# Patient Record
Sex: Male | Born: 1960 | Race: White | Hispanic: No | Marital: Married | Smoking: Never smoker
Health system: Southern US, Community
[De-identification: ages and names within clinical notes are randomized; demographics above are authoritative.]

## PROBLEM LIST (undated history)

## (undated) DIAGNOSIS — J45909 Unspecified asthma, uncomplicated: Secondary | ICD-10-CM

## (undated) DIAGNOSIS — M109 Gout, unspecified: Secondary | ICD-10-CM

## (undated) DIAGNOSIS — J189 Pneumonia, unspecified organism: Secondary | ICD-10-CM

## (undated) DIAGNOSIS — Z87442 Personal history of urinary calculi: Secondary | ICD-10-CM

## (undated) HISTORY — PX: CYSTOSCOPY: SUR368

---

## 2004-03-25 ENCOUNTER — Inpatient Hospital Stay (HOSPITAL_COMMUNITY): Admission: EM | Admit: 2004-03-25 | Discharge: 2004-03-27 | Payer: Self-pay | Admitting: Emergency Medicine

## 2006-11-09 ENCOUNTER — Encounter: Admission: RE | Admit: 2006-11-09 | Discharge: 2006-11-09 | Payer: Self-pay | Admitting: Emergency Medicine

## 2008-04-03 ENCOUNTER — Ambulatory Visit: Payer: Self-pay | Admitting: Cardiology

## 2008-04-24 ENCOUNTER — Ambulatory Visit: Payer: Self-pay

## 2008-04-24 ENCOUNTER — Ambulatory Visit: Payer: Self-pay | Admitting: Cardiology

## 2010-07-22 ENCOUNTER — Encounter: Admission: RE | Admit: 2010-07-22 | Discharge: 2010-07-22 | Payer: Self-pay | Admitting: Family Medicine

## 2011-04-29 NOTE — Procedures (Signed)
[facility]                              EXERCISE TREADMILL   Chris Mosley, Chris Mosley                         MRN:          [id number]  DATE:04/24/2008                            DOB:          15-Jun-1961    The duration of exercise was 9 minutes.  Maximum heart rate 155.  Percent of PMHR is 88%.  MET level 10.4.   INDICATIONS:  Atypical chest pain.   HISTORY OF EXERCISE TEST:  The patient exercised today on the Bruce  protocol for a total of 9 minutes.  Maximum heart rate reached 155,  which is effectively 88% of age-predicted maximum and the test is  diagnostic.  Resting blood pressure was 148/77 and the pulse was 79.  Peak blood pressure was 212/60.  The resting electrocardiogram was  within normal limits at maximum stress.  There was no ST-segment  depression and the study was felt to be a normal exercise response  electrocardiographically and clinically.  There was a modestly  hypertensive response.   CONCLUSIONS:  Negative exercise tolerance test demonstrating no evidence  of exercise-induced chest pain or significant ST depression.  Mild  hypertensive response.      Arturo Morton. Riley Kill, MD, [facility]  Electronically Signed    TDS/MedQ  DD: 05/09/2009  DT: 05/09/2009  Job #: [id number]   cc:   Oley Balm. Georgina Pillion, M.D.

## 2011-04-29 NOTE — Assessment & Plan Note (Signed)
[facility]                            CARDIOLOGY OFFICE NOTE   NAME:Lemanski, KEIJUAN SCHELLHASE                       MRN:          [id number]  DATE:04/03/2008                            DOB:          [birth year]    CHIEF COMPLAINT:  Neck pain, indigestion, all of the time.   HISTORY OF PRESENT ILLNESS:  Mr. Dispenza is a 50 year old gentleman  whose father is a patient of mine.  The patient has been referred by Dr.  Georgina Pillion for some atypical symptoms.  He has had some neck discomfort and  he says he has indigestion a lot.  His waist size is approximately 42.  He rarely has chest discomfort.  He is a nonsmoker, but has been  overweight.  He has what he describes as habitual indigestion.  He has  had some discomfort in the chest before with some soreness.  None of  this is related to exertion or activity and he burps a lot.  There is no  real radiation except the fact that it sometimes go on to the arm and  shoulder when he is just sitting and resting.  There is no diaphoresis  whatsoever.  He does get little soreness in the chest from time to time,  but he sleeps on his stomach.  He has been on Zocor for more than 1 year  for hypercholesterolemia.  He will walk up to 3 to 4 miles a day and do  yard work with rare discomfort.   PAST MEDICAL HISTORY:  There is no history of diabetes or hypertension.  The patient has had kidney stones, and has had a ureteroscopy.  He had a  back cyst of the spine.   SOCIAL HISTORY:  The patient will have a very rare cigar, otherwise, he  is a non smoker.  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   FAMILY HISTORY:  His mother is alive at 33 and well.  He has 1 brother,  2 sisters all okay.  His father is 38 and he has had bypass surgery.   REVIEW OF SYSTEMS:  He has rarely had some loose stools.  He has some  allergies.  His weight has been up and down ranging from 230-260 from  time to time.  He has not had  significant abdominal pain or discomfort.  He has tolerated the simvastatin.   PHYSICAL EXAMINATION:  GENERAL:  He is alert, oriented gentleman, in no  distress.  VITAL SIGNS:  The weight is 278 pounds.  Blood pressure 124/70, the  pulse is 64.  LUNGS:  Lung fields are clear to auscultation and percussion.  PMI is  nondisplaced.  There is normal first and second heart sound without  murmur, rub, or gallop.  ABDOMEN:  Obese without hepatosplenomegaly.  EXTREMITIES:  No extremity edema.  Pulses are intact equal bilaterally.   EKG reveals normal sinus rhythm within normal limits.   IMPRESSION:  1. Positive family history of coronary artery disease.  2. Moderate obesity.  3. Hypercholesterolemia on lipid lowering therapy.  4. Atypical chest discomfort  without exertional symptoms.   RECOMMENDATIONS:  1. A routine exercise tolerance test will be recommended.  2. Optimization of a cholesterol management.  3. Discussion regarding adult dysmetabolic syndrome, and the benefit      of weight reduction.     Arturo Morton. Riley Kill, MD, [facility]  Electronically Signed    TDS/MedQ  DD: 05/09/2009  DT: 05/10/2009  Job #: [id number]   cc:   Oley Balm. Georgina Pillion, M.D.

## 2011-05-02 NOTE — H&P (Signed)
NAME:  Chris Mosley, Chris Mosley NO.:  [id number]   MEDICAL RECORD NO.:  [id number]                   PATIENT TYPE:  INP   LOCATION:  0101                                 FACILITY:  [facility]   PHYSICIAN:  Isla Pence, M.D.             DATE OF BIRTH:  23-Feb-1961   DATE OF ADMISSION:  03/25/2004  DATE OF DISCHARGE:                                HISTORY & PHYSICAL   IDENTIFYING DATA:  This is a 50 year old Caucasian gentleman with no  significant past medical history whose primary care physician is Dr. Georgina Pillion.   CHIEF COMPLAINT:  Sick.   HISTORY OF PRESENT ILLNESS:  The patient is with no underlying respiratory  illness was sent to the emergency room by Dr. Georgina Pillion who after Dr. Georgina Pillion  had called and spoke to me about this gentleman with complaints of high  fever of 102.5 since Friday.  The patient says he has had this fever on and  off since then.  The lowest temperature has been 100.  He has taken Extra  Strength Tylenol and Advil, about three tablets of each of them at one time,  alternating with the other without any significant help in the fever.  The  patient denies any shortness of breath.  The only other symptom that  developed since the fever was a cough that started yesterday.  The patient  denies any rhinorrhea or sore throat.  The patient has noted a headache  today.  The patient notes the cough as being dry-hacky in nature, denies any  hemoptysis.  The patient denies any ear pain.  No one else has been sick.  He noted diarrhea on Saturday which lasted only for two episodes.  The  patient denies any tobacco use.  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  The patient has had no recent travel.  His last  travel was about three weeks ago, and that was only a two hour flight to  somewhere in [place].  The patient has been very, very active, and has  not been sedentary at all.  The patient also denies any weight loss.   The  patient denies once again asthma, history of emphysema.  He is not a  diabetic either.   Dr. Georgina Pillion, his primary care physician, had called me from the clinic with  concerns of a left lower lobe pneumonia.  His saturations on room air in the  clinic were 91 to 93%.  He did have a CBC done there which showed a normal  white count at 7000, H&H of 15.2 and 44.4.  The neutrophils on this  differential were 69, and lymphocytes of 25.   ALLERGIES:  No known drug allergies.   CURRENT MEDICATIONS:  Asides from the ones mentioned above, he is not on any  chronic medications.   PAST MEDICAL HISTORY:  History of nephrolithiasis.  Once again,  he denies  any hypertension, diabetes mellitus, high cholesterol.   PAST SURGICAL HISTORY:  1. Kidney stones removed x2.  He cannot remember which side they were.     These were cystine stones.  2. Dental extractions x2.   SOCIAL HISTORY:  He has been married for the past seven years.  This is his  first marriage.  He has two step-kids from his current wife.  He works in  Audiological scientist.  Once again, denies any tobacco.  Alcohol-wise he  drinks an occasional couple of glasses of wine, maybe two to three times a  week.   FAMILY HISTORY:  Positive for diabetes mellitus in the maternal grandmother  and mother, whom he says might have borderline diabetes, so it sounds like  she probably is a diabetic.  Hypertension in the oldest sister.  There is no  coronary artery disease or MI in the family.  There is no CVA history in the  family.  There is breast cancer in the mother who is currently alive at 58.  She was diagnosed at the age of 40.  The patient denies any colon or  prostate cancer in the family.  There is no family history of emphysema  either.   REVIEW OF SYSTEMS:  As per HPI.  He otherwise notes some mild back pain and  some left-sided chest pain secondary to his coughing.  Otherwise, the  patient denies heartburn, melena,  hematochezia.  The patient has also noted  some mild burning on urination that just started either yesterday or today,  but denies any hematuria.  He denies his back pain as what he would normally  get if he had an active kidney stone.   PHYSICAL EXAMINATION:  VITAL SIGNS:  His initial vitals in the ER:  Blood  pressure 128/79, pulse of 96, temperature 101.2, respiratory rate of 22, his  saturations on room air were 94 to 96%.  GENERAL:  He does not appear to be dyspneic or tachypneic, and he certainly  does not appear to be terribly sick.  HEENT:  His TMs are normal.  Nose normal mucosa.  Oropharynx there is no  exudates, no significant erythema either.  NECK:  There is no significant adenopathy.  LUNGS:  Actually clear to auscultation bilaterally without any crackles or  wheezes.  There might be very minimally when you compared to the right,  slightly diminished breath sounds on the left.  HEART:  Regular rate and rhythm without any murmurs, rubs, or gallops.  ABDOMEN:  Bowel sounds are normal, soft, nontender.  No organomegaly.  BACK:  There is no CVA angle tenderness, no spinous tenderness.  EXTREMITIES:  He moves all extremities.  There is no edema.  NEUROLOGIC:  Grossly normal.   LABORATORY DATA:  The only two labs that I have is the blood gas on room air  with a pH of 7.44, PCO2 of 37, PAO2 of 79, bicarbonate was 24.  His CBC has  come back normal with a white count of 6.7, H&H of 14.5 and 42.2, platelet  count of 218,000.  His differential is also normal with 58% neutrophils,  lymphocytes at 29%.  His BNP is currently still pending.  I did not order  any other labs at this point in time.  His chest x-ray that came with him  from Dr. Rosanne Ashing office shows density in the left lower lobe area which can  also be picked out on the lateral more nodular in appearance  on the lateral.  The PA view looks more like an infiltrative process, although you can make out a little nodular  lesion.   ASSESSMENT AND PLAN:  1. Most likely community acquired lower lobe pneumonia.  Clinically, with     all of the parameters with him not being dyspneic, with his blood gas     being normal, and his normal white count, he probably actually could go     home with Avelox orally, but the only reason I am going to go ahead and     admit him is because his fever apparently at home has not been able to be     brought down with the Tylenol and Advil that he had been doing.  So, it     may be a good idea for [place] to watch him for a day or two and see how he     does.  The chest x-ray will be sent for official reading by a radiologist     here to see whether they call it an infiltrative process or more of a     nodular mass-like lesion.  In two days, we will go ahead and repeat chest     x-ray to see if this becomes more _____________infiltrative process.  If     there is concerns for a nodularity, then consideration for a CT scan of     the chest can be made.  Clinically, it sounds more like a pneumonic     process, and actually suggests more of an atypical pneumonic process.     For that reason, I have done a Legionella titer also.  But once again,     there are really no risk factors, no constitutional symptoms suggestive     of a tumor-like process going on.  He certainly does not have any risk     factors for pulmonary embolism either at this point in time.  Therefore,     I will go ahead and do the usual intravenous Avelox which will give [place]     broad spectrum coverage and cover the atypicals, and repeat the chest x-     ray.  He most likely has some suggestion of mild dehydration with his     pneumonia and high fever, therefore, intravenous fluids will also be     given.  In addition, he will have albuterol nebulizers to help loosen up     things in addition to the Humibid DM.  I have informed both him and the     wife my thought process and the fact that this man, especially if it      looks more nodular in character and the radiologist calls it the same, he     certainly will need a repeat chest x-ray four weeks down the road.  All     of this can be determined by the doctor who will be taking care of him     during his hospital stay here.  2. For gastrointestinal prophylaxis, we will place him on Protonix.  3. For deep venous thrombosis prophylaxis especially with this pneumonia, we     will place him on Lovenox subcutaneously.                                               Isla Pence,  M.D.    RRV/MEDQ  D:  03/25/2004  T:  03/25/2004  Job:  [id number]   cc:   Oley Balm. Georgina Pillion, M.D.  [address]  [place]  [place] [address]  Fax: [phone]

## 2012-12-23 ENCOUNTER — Other Ambulatory Visit: Payer: Self-pay | Admitting: Gastroenterology

## 2012-12-23 DIAGNOSIS — R1310 Dysphagia, unspecified: Secondary | ICD-10-CM

## 2012-12-27 ENCOUNTER — Ambulatory Visit
Admission: RE | Admit: 2012-12-27 | Discharge: 2012-12-27 | Disposition: A | Discharge status: Home / Self Care | Payer: BC Managed Care – PPO | Source: Ambulatory Visit | Attending: Gastroenterology | Admitting: Gastroenterology

## 2012-12-27 DIAGNOSIS — R1310 Dysphagia, unspecified: Secondary | ICD-10-CM

## 2013-05-02 ENCOUNTER — Ambulatory Visit
Admission: RE | Admit: 2013-05-02 | Discharge: 2013-05-02 | Disposition: A | Discharge status: Home / Self Care | Payer: BC Managed Care – PPO | Source: Ambulatory Visit | Attending: Family Medicine | Admitting: Family Medicine

## 2013-05-02 ENCOUNTER — Other Ambulatory Visit: Payer: Self-pay | Admitting: Family Medicine

## 2013-05-02 DIAGNOSIS — R059 Cough, unspecified: Secondary | ICD-10-CM

## 2013-05-02 DIAGNOSIS — R05 Cough: Secondary | ICD-10-CM

## 2014-12-05 ENCOUNTER — Other Ambulatory Visit: Payer: Self-pay | Admitting: Family Medicine

## 2014-12-05 DIAGNOSIS — N200 Calculus of kidney: Secondary | ICD-10-CM

## 2014-12-06 ENCOUNTER — Ambulatory Visit
Admission: RE | Admit: 2014-12-06 | Discharge: 2014-12-06 | Disposition: A | Discharge status: Home / Self Care | Payer: BC Managed Care – PPO | Source: Ambulatory Visit | Attending: Family Medicine | Admitting: Family Medicine

## 2014-12-06 DIAGNOSIS — N200 Calculus of kidney: Secondary | ICD-10-CM

## 2015-08-20 IMAGING — CT CT ABD-PELV W/O CM
2 of 4 series · 13 of 36 positions shown, 19 images · non-contrast
Comparison: None.

CLINICAL DATA: Left flank pain for 2 weeks, history of renal
stones, microscopic hematuria

EXAM:
CT ABDOMEN AND PELVIS WITHOUT CONTRAST
TECHNIQUE: Multidetector CT imaging of the abdomen and pelvis was performed
following the standard protocol without IV contrast.

[Series 601: coronal body · coronal · 1.02mm/px · 1 of 144 slices shown, 2 images]
[im 48/144  soft-tissue]
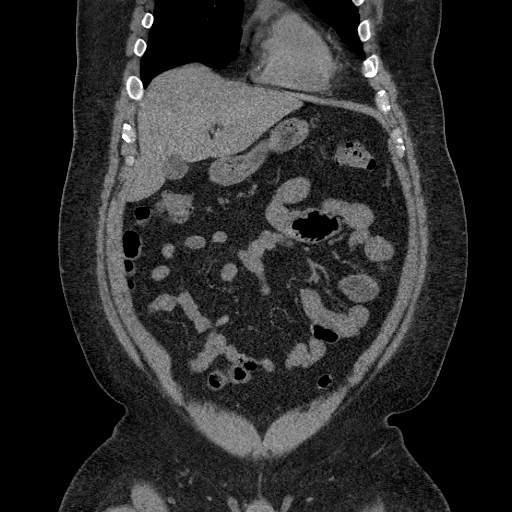
[im 48/144  bone]
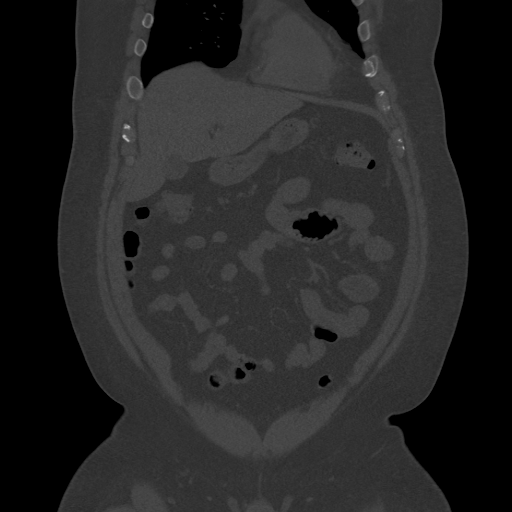

[Series 602: sagittal body · sagittal · 1.02mm/px · 12 of 188 slices shown, 17 images]
[im 11/188  soft-tissue]
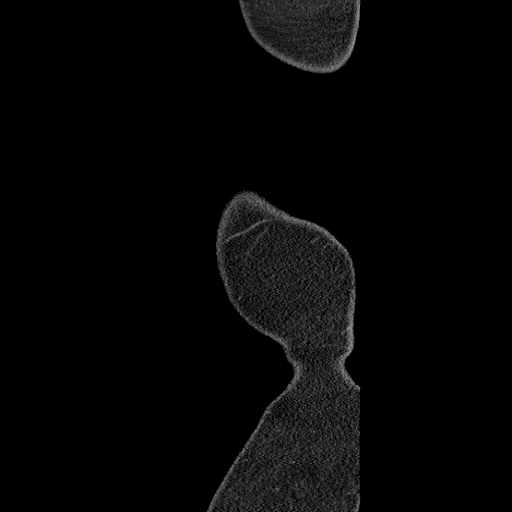
[im 11/188  lung]
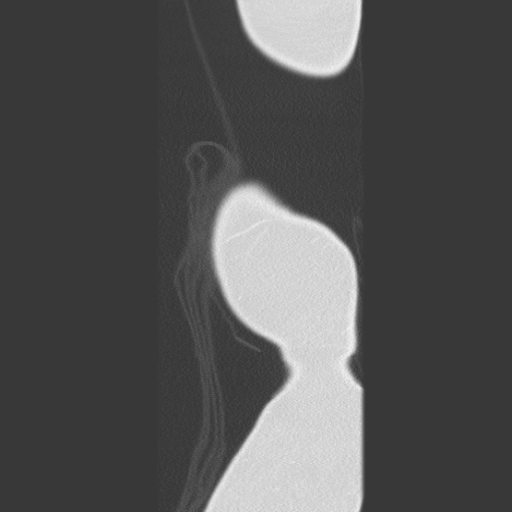
[im 11/188  bone]
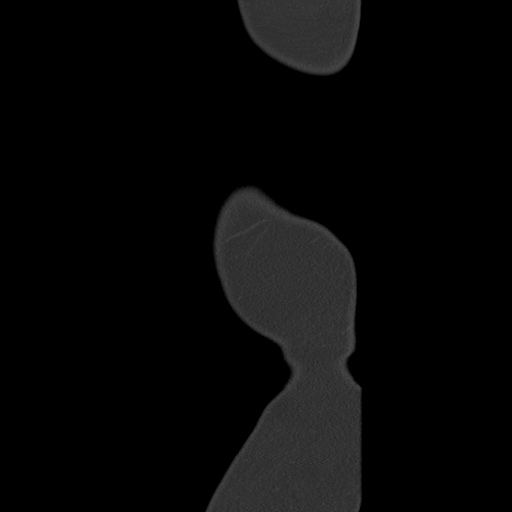
[im 21/188  lung]
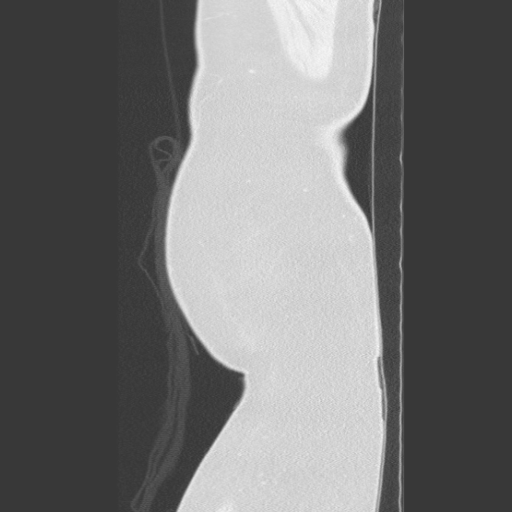
[im 32/188  soft-tissue]
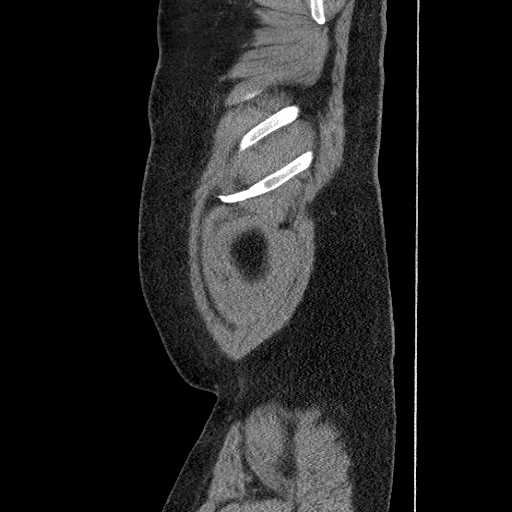
[im 32/188  lung]
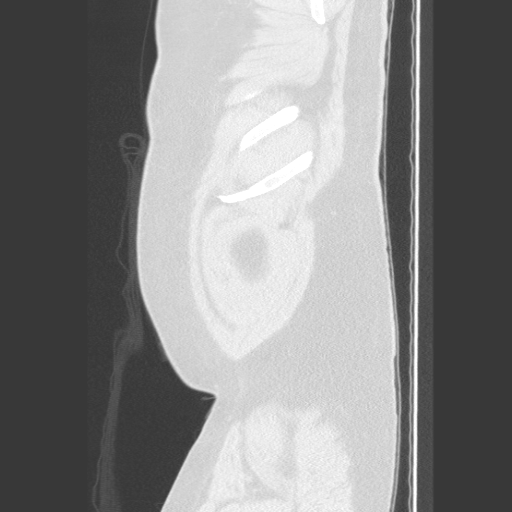
[im 42/188  soft-tissue]
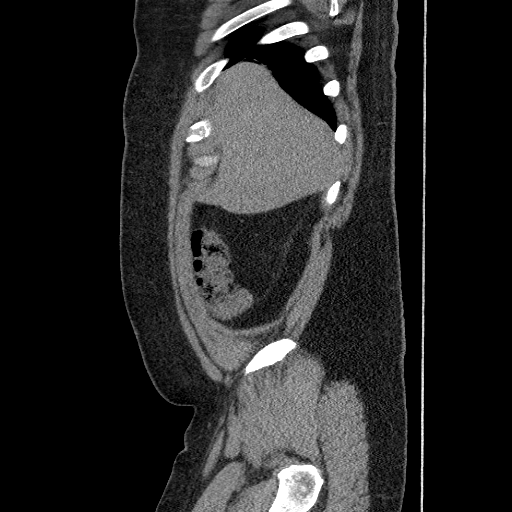
[im 42/188  lung]
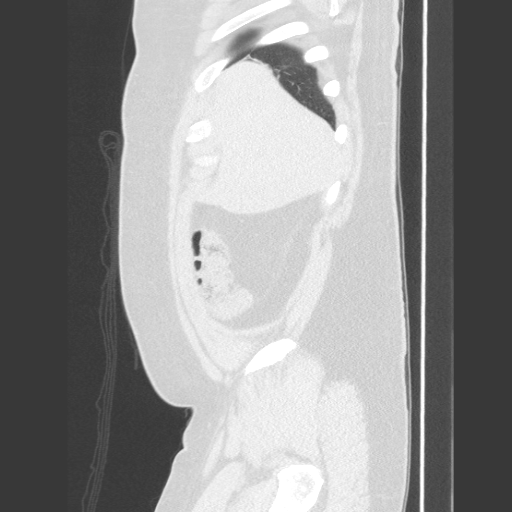
[im 63/188  soft-tissue]
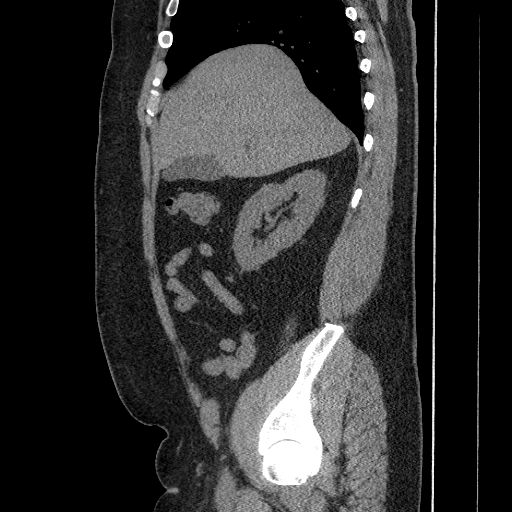
[im 73/188  soft-tissue]
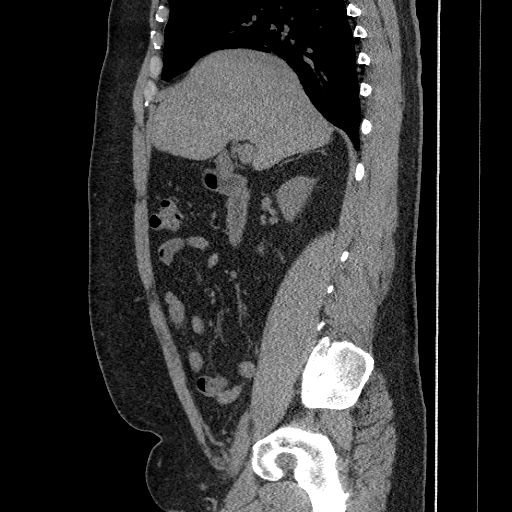
[im 94/188  soft-tissue]
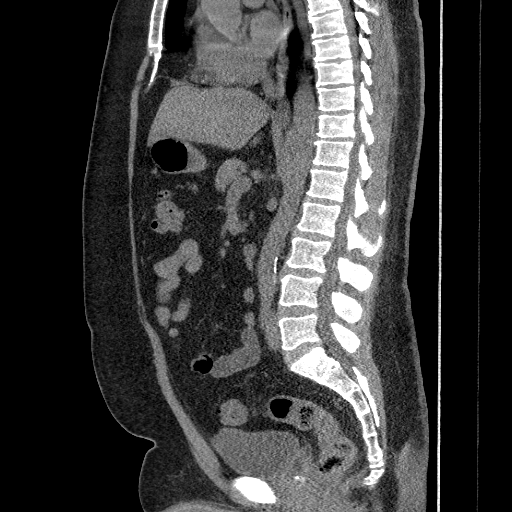
[im 115/188  soft-tissue]
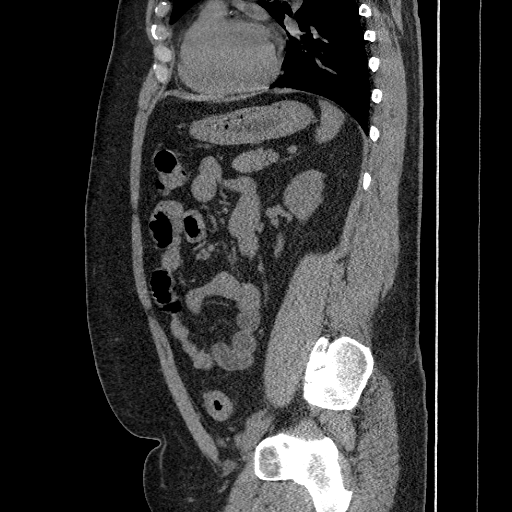
[im 125/188  soft-tissue]
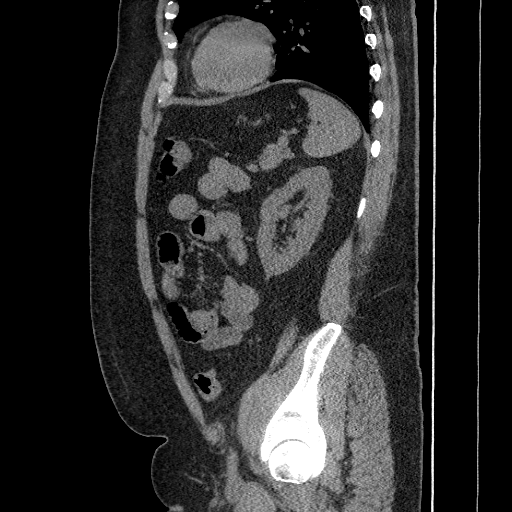
[im 146/188  soft-tissue]
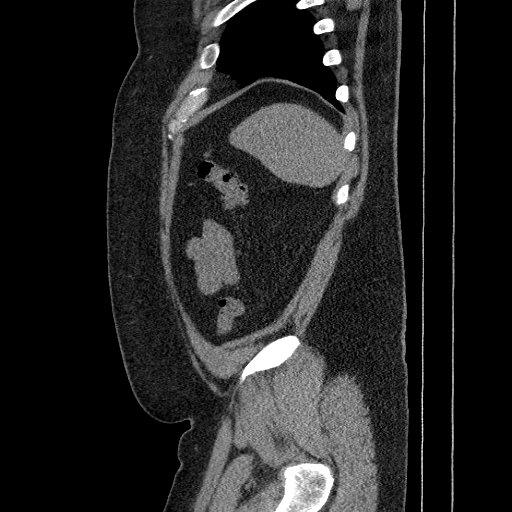
[im 146/188  bone]
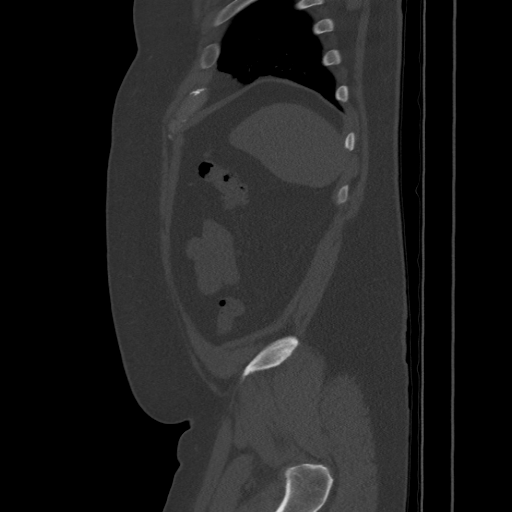
[im 156/188  soft-tissue]
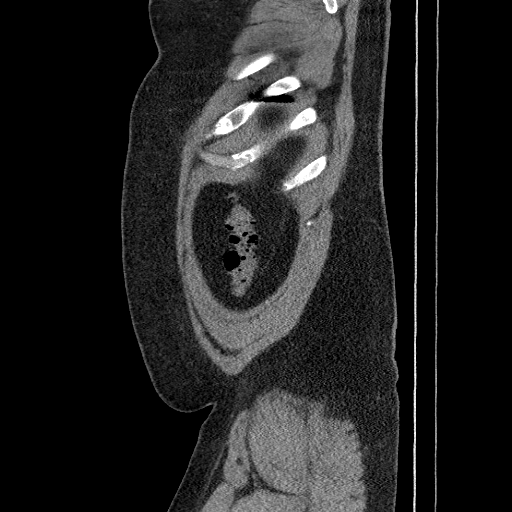
[im 177/188  soft-tissue]
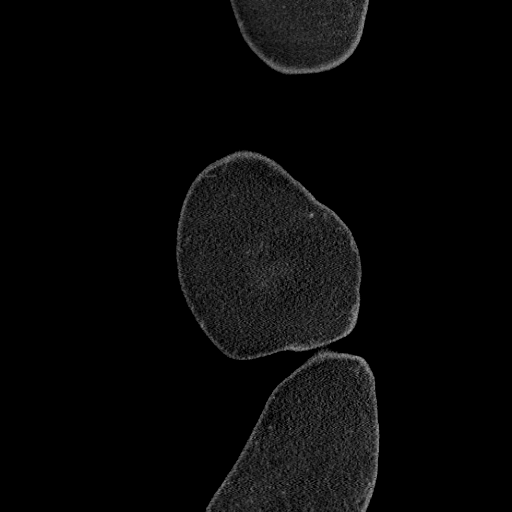

[13 of 36 positions shown; findings below may reference images not displayed]

FINDINGS: Lung bases are unremarkable.

Sagittal images of the spine shows mild degenerative changes lower
thoracic spine. Unenhanced liver shows no biliary ductal dilatation.

No calcified gallstones are noted within gallbladder. Unenhanced
pancreas, spleen and adrenal glands are unremarkable.

Unenhanced kidneys are symmetrical in size. No nephrolithiasis. No
hydronephrosis or hydroureter. No calcified ureteral calculi are
noted bilaterally. Bilateral distal ureter is unremarkable. No
calcified calculi are noted within urinary bladder.

No small bowel obstruction. No aortic aneurysm. Mild atherosclerotic
calcifications distal abdominal aorta. No ascites or free air. No
adenopathy. Normal appendix clearly visualized axial image 69. No
pericecal inflammation.

Few colonic diverticula are noted left colon and proximal sigmoid
colon. No evidence of acute diverticulitis.
IMPRESSION: 1. No nephrolithiasis.  No hydronephrosis or hydroureter.
2. No calcified ureteral calculi are noted.
3. Mild atherosclerotic calcifications distal abdominal aorta.
4. Few colonic diverticula are noted left colon and proximal sigmoid
colon. No evidence of acute diverticulitis.
5. Normal appendix.  No pericecal inflammation.

## 2016-03-21 DIAGNOSIS — J3089 Other allergic rhinitis: Secondary | ICD-10-CM | POA: Diagnosis not present

## 2016-03-21 DIAGNOSIS — J301 Allergic rhinitis due to pollen: Secondary | ICD-10-CM | POA: Diagnosis not present

## 2016-03-25 DIAGNOSIS — J301 Allergic rhinitis due to pollen: Secondary | ICD-10-CM | POA: Diagnosis not present

## 2016-03-25 DIAGNOSIS — J3089 Other allergic rhinitis: Secondary | ICD-10-CM | POA: Diagnosis not present

## 2016-03-31 DIAGNOSIS — J301 Allergic rhinitis due to pollen: Secondary | ICD-10-CM | POA: Diagnosis not present

## 2016-03-31 DIAGNOSIS — J3089 Other allergic rhinitis: Secondary | ICD-10-CM | POA: Diagnosis not present

## 2016-04-08 DIAGNOSIS — J301 Allergic rhinitis due to pollen: Secondary | ICD-10-CM | POA: Diagnosis not present

## 2016-04-08 DIAGNOSIS — J3089 Other allergic rhinitis: Secondary | ICD-10-CM | POA: Diagnosis not present

## 2016-04-14 DIAGNOSIS — J301 Allergic rhinitis due to pollen: Secondary | ICD-10-CM | POA: Diagnosis not present

## 2016-04-14 DIAGNOSIS — J3089 Other allergic rhinitis: Secondary | ICD-10-CM | POA: Diagnosis not present

## 2016-04-22 DIAGNOSIS — J301 Allergic rhinitis due to pollen: Secondary | ICD-10-CM | POA: Diagnosis not present

## 2016-04-22 DIAGNOSIS — J3089 Other allergic rhinitis: Secondary | ICD-10-CM | POA: Diagnosis not present

## 2016-04-24 DIAGNOSIS — J301 Allergic rhinitis due to pollen: Secondary | ICD-10-CM | POA: Diagnosis not present

## 2016-04-24 DIAGNOSIS — J3089 Other allergic rhinitis: Secondary | ICD-10-CM | POA: Diagnosis not present

## 2016-04-28 DIAGNOSIS — J4540 Moderate persistent asthma, uncomplicated: Secondary | ICD-10-CM | POA: Diagnosis not present

## 2016-04-28 DIAGNOSIS — J301 Allergic rhinitis due to pollen: Secondary | ICD-10-CM | POA: Diagnosis not present

## 2016-04-28 DIAGNOSIS — J3089 Other allergic rhinitis: Secondary | ICD-10-CM | POA: Diagnosis not present

## 2016-05-09 DIAGNOSIS — J301 Allergic rhinitis due to pollen: Secondary | ICD-10-CM | POA: Diagnosis not present

## 2016-05-09 DIAGNOSIS — J3089 Other allergic rhinitis: Secondary | ICD-10-CM | POA: Diagnosis not present

## 2016-05-15 DIAGNOSIS — J3089 Other allergic rhinitis: Secondary | ICD-10-CM | POA: Diagnosis not present

## 2016-05-15 DIAGNOSIS — J301 Allergic rhinitis due to pollen: Secondary | ICD-10-CM | POA: Diagnosis not present

## 2016-06-03 DIAGNOSIS — J301 Allergic rhinitis due to pollen: Secondary | ICD-10-CM | POA: Diagnosis not present

## 2016-06-03 DIAGNOSIS — J3089 Other allergic rhinitis: Secondary | ICD-10-CM | POA: Diagnosis not present

## 2016-06-06 DIAGNOSIS — J3089 Other allergic rhinitis: Secondary | ICD-10-CM | POA: Diagnosis not present

## 2016-06-06 DIAGNOSIS — J301 Allergic rhinitis due to pollen: Secondary | ICD-10-CM | POA: Diagnosis not present

## 2016-06-13 DIAGNOSIS — J3089 Other allergic rhinitis: Secondary | ICD-10-CM | POA: Diagnosis not present

## 2016-06-13 DIAGNOSIS — J301 Allergic rhinitis due to pollen: Secondary | ICD-10-CM | POA: Diagnosis not present

## 2016-06-27 DIAGNOSIS — J019 Acute sinusitis, unspecified: Secondary | ICD-10-CM | POA: Diagnosis not present

## 2016-07-02 DIAGNOSIS — J329 Chronic sinusitis, unspecified: Secondary | ICD-10-CM | POA: Diagnosis not present

## 2016-07-02 DIAGNOSIS — R05 Cough: Secondary | ICD-10-CM | POA: Diagnosis not present

## 2016-07-02 DIAGNOSIS — R062 Wheezing: Secondary | ICD-10-CM | POA: Diagnosis not present

## 2016-07-08 DIAGNOSIS — J301 Allergic rhinitis due to pollen: Secondary | ICD-10-CM | POA: Diagnosis not present

## 2016-07-08 DIAGNOSIS — J3089 Other allergic rhinitis: Secondary | ICD-10-CM | POA: Diagnosis not present

## 2016-07-14 DIAGNOSIS — J301 Allergic rhinitis due to pollen: Secondary | ICD-10-CM | POA: Diagnosis not present

## 2016-07-14 DIAGNOSIS — J3089 Other allergic rhinitis: Secondary | ICD-10-CM | POA: Diagnosis not present

## 2016-07-21 DIAGNOSIS — J301 Allergic rhinitis due to pollen: Secondary | ICD-10-CM | POA: Diagnosis not present

## 2016-07-21 DIAGNOSIS — J3089 Other allergic rhinitis: Secondary | ICD-10-CM | POA: Diagnosis not present

## 2016-07-28 DIAGNOSIS — J3089 Other allergic rhinitis: Secondary | ICD-10-CM | POA: Diagnosis not present

## 2016-07-28 DIAGNOSIS — J301 Allergic rhinitis due to pollen: Secondary | ICD-10-CM | POA: Diagnosis not present

## 2016-07-28 DIAGNOSIS — S00419A Abrasion of unspecified ear, initial encounter: Secondary | ICD-10-CM | POA: Diagnosis not present

## 2016-07-28 DIAGNOSIS — H6121 Impacted cerumen, right ear: Secondary | ICD-10-CM | POA: Diagnosis not present

## 2016-07-28 DIAGNOSIS — L309 Dermatitis, unspecified: Secondary | ICD-10-CM | POA: Diagnosis not present

## 2016-08-07 DIAGNOSIS — H9221 Otorrhagia, right ear: Secondary | ICD-10-CM | POA: Diagnosis not present

## 2016-08-07 DIAGNOSIS — K219 Gastro-esophageal reflux disease without esophagitis: Secondary | ICD-10-CM | POA: Diagnosis not present

## 2016-08-21 DIAGNOSIS — J3089 Other allergic rhinitis: Secondary | ICD-10-CM | POA: Diagnosis not present

## 2016-08-21 DIAGNOSIS — J301 Allergic rhinitis due to pollen: Secondary | ICD-10-CM | POA: Diagnosis not present

## 2016-08-26 DIAGNOSIS — J301 Allergic rhinitis due to pollen: Secondary | ICD-10-CM | POA: Diagnosis not present

## 2016-08-26 DIAGNOSIS — J3089 Other allergic rhinitis: Secondary | ICD-10-CM | POA: Diagnosis not present

## 2016-09-04 DIAGNOSIS — J301 Allergic rhinitis due to pollen: Secondary | ICD-10-CM | POA: Diagnosis not present

## 2016-09-04 DIAGNOSIS — J3089 Other allergic rhinitis: Secondary | ICD-10-CM | POA: Diagnosis not present

## 2016-09-12 DIAGNOSIS — J301 Allergic rhinitis due to pollen: Secondary | ICD-10-CM | POA: Diagnosis not present

## 2016-09-12 DIAGNOSIS — J3089 Other allergic rhinitis: Secondary | ICD-10-CM | POA: Diagnosis not present

## 2016-09-16 DIAGNOSIS — L814 Other melanin hyperpigmentation: Secondary | ICD-10-CM | POA: Diagnosis not present

## 2016-09-16 DIAGNOSIS — D225 Melanocytic nevi of trunk: Secondary | ICD-10-CM | POA: Diagnosis not present

## 2016-09-16 DIAGNOSIS — L259 Unspecified contact dermatitis, unspecified cause: Secondary | ICD-10-CM | POA: Diagnosis not present

## 2016-09-18 DIAGNOSIS — J3089 Other allergic rhinitis: Secondary | ICD-10-CM | POA: Diagnosis not present

## 2016-09-18 DIAGNOSIS — J301 Allergic rhinitis due to pollen: Secondary | ICD-10-CM | POA: Diagnosis not present

## 2016-09-26 DIAGNOSIS — J301 Allergic rhinitis due to pollen: Secondary | ICD-10-CM | POA: Diagnosis not present

## 2016-09-26 DIAGNOSIS — J3089 Other allergic rhinitis: Secondary | ICD-10-CM | POA: Diagnosis not present

## 2016-10-01 DIAGNOSIS — J301 Allergic rhinitis due to pollen: Secondary | ICD-10-CM | POA: Diagnosis not present

## 2016-10-01 DIAGNOSIS — J3089 Other allergic rhinitis: Secondary | ICD-10-CM | POA: Diagnosis not present

## 2016-10-09 DIAGNOSIS — J3089 Other allergic rhinitis: Secondary | ICD-10-CM | POA: Diagnosis not present

## 2016-10-09 DIAGNOSIS — J301 Allergic rhinitis due to pollen: Secondary | ICD-10-CM | POA: Diagnosis not present

## 2016-10-16 DIAGNOSIS — J3089 Other allergic rhinitis: Secondary | ICD-10-CM | POA: Diagnosis not present

## 2016-10-16 DIAGNOSIS — J301 Allergic rhinitis due to pollen: Secondary | ICD-10-CM | POA: Diagnosis not present

## 2016-10-23 DIAGNOSIS — J301 Allergic rhinitis due to pollen: Secondary | ICD-10-CM | POA: Diagnosis not present

## 2016-10-23 DIAGNOSIS — J3089 Other allergic rhinitis: Secondary | ICD-10-CM | POA: Diagnosis not present

## 2016-10-30 DIAGNOSIS — J301 Allergic rhinitis due to pollen: Secondary | ICD-10-CM | POA: Diagnosis not present

## 2016-10-30 DIAGNOSIS — J3089 Other allergic rhinitis: Secondary | ICD-10-CM | POA: Diagnosis not present

## 2016-11-04 DIAGNOSIS — J301 Allergic rhinitis due to pollen: Secondary | ICD-10-CM | POA: Diagnosis not present

## 2016-11-04 DIAGNOSIS — J3089 Other allergic rhinitis: Secondary | ICD-10-CM | POA: Diagnosis not present

## 2016-11-11 DIAGNOSIS — J301 Allergic rhinitis due to pollen: Secondary | ICD-10-CM | POA: Diagnosis not present

## 2016-11-11 DIAGNOSIS — J3089 Other allergic rhinitis: Secondary | ICD-10-CM | POA: Diagnosis not present

## 2016-11-17 DIAGNOSIS — J3089 Other allergic rhinitis: Secondary | ICD-10-CM | POA: Diagnosis not present

## 2016-11-17 DIAGNOSIS — J301 Allergic rhinitis due to pollen: Secondary | ICD-10-CM | POA: Diagnosis not present

## 2016-11-28 DIAGNOSIS — J301 Allergic rhinitis due to pollen: Secondary | ICD-10-CM | POA: Diagnosis not present

## 2016-11-28 DIAGNOSIS — J3089 Other allergic rhinitis: Secondary | ICD-10-CM | POA: Diagnosis not present

## 2016-12-10 DIAGNOSIS — J3089 Other allergic rhinitis: Secondary | ICD-10-CM | POA: Diagnosis not present

## 2016-12-10 DIAGNOSIS — J301 Allergic rhinitis due to pollen: Secondary | ICD-10-CM | POA: Diagnosis not present

## 2016-12-19 DIAGNOSIS — J301 Allergic rhinitis due to pollen: Secondary | ICD-10-CM | POA: Diagnosis not present

## 2016-12-19 DIAGNOSIS — J3089 Other allergic rhinitis: Secondary | ICD-10-CM | POA: Diagnosis not present

## 2016-12-25 DIAGNOSIS — J3089 Other allergic rhinitis: Secondary | ICD-10-CM | POA: Diagnosis not present

## 2016-12-25 DIAGNOSIS — J301 Allergic rhinitis due to pollen: Secondary | ICD-10-CM | POA: Diagnosis not present

## 2017-01-02 DIAGNOSIS — J301 Allergic rhinitis due to pollen: Secondary | ICD-10-CM | POA: Diagnosis not present

## 2017-01-02 DIAGNOSIS — J3089 Other allergic rhinitis: Secondary | ICD-10-CM | POA: Diagnosis not present

## 2017-01-05 DIAGNOSIS — J4540 Moderate persistent asthma, uncomplicated: Secondary | ICD-10-CM | POA: Diagnosis not present

## 2017-01-05 DIAGNOSIS — J301 Allergic rhinitis due to pollen: Secondary | ICD-10-CM | POA: Diagnosis not present

## 2017-01-05 DIAGNOSIS — J3089 Other allergic rhinitis: Secondary | ICD-10-CM | POA: Diagnosis not present

## 2017-01-05 DIAGNOSIS — R430 Anosmia: Secondary | ICD-10-CM | POA: Diagnosis not present

## 2017-01-09 DIAGNOSIS — J3089 Other allergic rhinitis: Secondary | ICD-10-CM | POA: Diagnosis not present

## 2017-01-09 DIAGNOSIS — J301 Allergic rhinitis due to pollen: Secondary | ICD-10-CM | POA: Diagnosis not present

## 2017-01-15 DIAGNOSIS — J3089 Other allergic rhinitis: Secondary | ICD-10-CM | POA: Diagnosis not present

## 2017-01-15 DIAGNOSIS — J301 Allergic rhinitis due to pollen: Secondary | ICD-10-CM | POA: Diagnosis not present

## 2017-01-23 DIAGNOSIS — J301 Allergic rhinitis due to pollen: Secondary | ICD-10-CM | POA: Diagnosis not present

## 2017-01-23 DIAGNOSIS — J3089 Other allergic rhinitis: Secondary | ICD-10-CM | POA: Diagnosis not present

## 2017-01-30 DIAGNOSIS — J3089 Other allergic rhinitis: Secondary | ICD-10-CM | POA: Diagnosis not present

## 2017-01-30 DIAGNOSIS — J301 Allergic rhinitis due to pollen: Secondary | ICD-10-CM | POA: Diagnosis not present

## 2017-02-03 DIAGNOSIS — Z0000 Encounter for general adult medical examination without abnormal findings: Secondary | ICD-10-CM | POA: Diagnosis not present

## 2017-02-03 DIAGNOSIS — Z125 Encounter for screening for malignant neoplasm of prostate: Secondary | ICD-10-CM | POA: Diagnosis not present

## 2017-02-03 DIAGNOSIS — G4700 Insomnia, unspecified: Secondary | ICD-10-CM | POA: Diagnosis not present

## 2017-02-03 DIAGNOSIS — M109 Gout, unspecified: Secondary | ICD-10-CM | POA: Diagnosis not present

## 2017-02-03 DIAGNOSIS — Z79899 Other long term (current) drug therapy: Secondary | ICD-10-CM | POA: Diagnosis not present

## 2017-02-03 DIAGNOSIS — E7800 Pure hypercholesterolemia, unspecified: Secondary | ICD-10-CM | POA: Diagnosis not present

## 2017-02-06 DIAGNOSIS — J301 Allergic rhinitis due to pollen: Secondary | ICD-10-CM | POA: Diagnosis not present

## 2017-02-06 DIAGNOSIS — J3089 Other allergic rhinitis: Secondary | ICD-10-CM | POA: Diagnosis not present

## 2017-02-13 DIAGNOSIS — J301 Allergic rhinitis due to pollen: Secondary | ICD-10-CM | POA: Diagnosis not present

## 2017-02-13 DIAGNOSIS — J3089 Other allergic rhinitis: Secondary | ICD-10-CM | POA: Diagnosis not present

## 2017-02-20 DIAGNOSIS — J301 Allergic rhinitis due to pollen: Secondary | ICD-10-CM | POA: Diagnosis not present

## 2017-02-20 DIAGNOSIS — J3089 Other allergic rhinitis: Secondary | ICD-10-CM | POA: Diagnosis not present

## 2017-02-27 DIAGNOSIS — J3089 Other allergic rhinitis: Secondary | ICD-10-CM | POA: Diagnosis not present

## 2017-02-27 DIAGNOSIS — J301 Allergic rhinitis due to pollen: Secondary | ICD-10-CM | POA: Diagnosis not present

## 2017-03-06 DIAGNOSIS — J301 Allergic rhinitis due to pollen: Secondary | ICD-10-CM | POA: Diagnosis not present

## 2017-03-06 DIAGNOSIS — J3089 Other allergic rhinitis: Secondary | ICD-10-CM | POA: Diagnosis not present

## 2017-03-12 DIAGNOSIS — J3089 Other allergic rhinitis: Secondary | ICD-10-CM | POA: Diagnosis not present

## 2017-03-12 DIAGNOSIS — J301 Allergic rhinitis due to pollen: Secondary | ICD-10-CM | POA: Diagnosis not present

## 2017-03-20 DIAGNOSIS — J3089 Other allergic rhinitis: Secondary | ICD-10-CM | POA: Diagnosis not present

## 2017-03-20 DIAGNOSIS — J301 Allergic rhinitis due to pollen: Secondary | ICD-10-CM | POA: Diagnosis not present

## 2017-03-27 DIAGNOSIS — J3089 Other allergic rhinitis: Secondary | ICD-10-CM | POA: Diagnosis not present

## 2017-03-27 DIAGNOSIS — J301 Allergic rhinitis due to pollen: Secondary | ICD-10-CM | POA: Diagnosis not present

## 2017-04-03 DIAGNOSIS — J3089 Other allergic rhinitis: Secondary | ICD-10-CM | POA: Diagnosis not present

## 2017-04-03 DIAGNOSIS — J301 Allergic rhinitis due to pollen: Secondary | ICD-10-CM | POA: Diagnosis not present

## 2017-04-10 DIAGNOSIS — J3089 Other allergic rhinitis: Secondary | ICD-10-CM | POA: Diagnosis not present

## 2017-04-10 DIAGNOSIS — J301 Allergic rhinitis due to pollen: Secondary | ICD-10-CM | POA: Diagnosis not present

## 2017-04-14 DIAGNOSIS — J3089 Other allergic rhinitis: Secondary | ICD-10-CM | POA: Diagnosis not present

## 2017-04-14 DIAGNOSIS — J301 Allergic rhinitis due to pollen: Secondary | ICD-10-CM | POA: Diagnosis not present

## 2017-04-17 DIAGNOSIS — J301 Allergic rhinitis due to pollen: Secondary | ICD-10-CM | POA: Diagnosis not present

## 2017-04-17 DIAGNOSIS — J3089 Other allergic rhinitis: Secondary | ICD-10-CM | POA: Diagnosis not present

## 2017-04-27 DIAGNOSIS — J3089 Other allergic rhinitis: Secondary | ICD-10-CM | POA: Diagnosis not present

## 2017-04-27 DIAGNOSIS — J301 Allergic rhinitis due to pollen: Secondary | ICD-10-CM | POA: Diagnosis not present

## 2017-05-04 DIAGNOSIS — J3089 Other allergic rhinitis: Secondary | ICD-10-CM | POA: Diagnosis not present

## 2017-05-04 DIAGNOSIS — J301 Allergic rhinitis due to pollen: Secondary | ICD-10-CM | POA: Diagnosis not present

## 2017-05-12 DIAGNOSIS — J301 Allergic rhinitis due to pollen: Secondary | ICD-10-CM | POA: Diagnosis not present

## 2017-05-12 DIAGNOSIS — J3089 Other allergic rhinitis: Secondary | ICD-10-CM | POA: Diagnosis not present

## 2017-05-22 DIAGNOSIS — J301 Allergic rhinitis due to pollen: Secondary | ICD-10-CM | POA: Diagnosis not present

## 2017-05-22 DIAGNOSIS — J3089 Other allergic rhinitis: Secondary | ICD-10-CM | POA: Diagnosis not present

## 2017-05-29 DIAGNOSIS — J3089 Other allergic rhinitis: Secondary | ICD-10-CM | POA: Diagnosis not present

## 2017-05-29 DIAGNOSIS — J301 Allergic rhinitis due to pollen: Secondary | ICD-10-CM | POA: Diagnosis not present

## 2017-06-02 DIAGNOSIS — J3089 Other allergic rhinitis: Secondary | ICD-10-CM | POA: Diagnosis not present

## 2017-06-02 DIAGNOSIS — J301 Allergic rhinitis due to pollen: Secondary | ICD-10-CM | POA: Diagnosis not present

## 2017-06-05 DIAGNOSIS — J301 Allergic rhinitis due to pollen: Secondary | ICD-10-CM | POA: Diagnosis not present

## 2017-06-05 DIAGNOSIS — J3089 Other allergic rhinitis: Secondary | ICD-10-CM | POA: Diagnosis not present

## 2017-06-24 DIAGNOSIS — J3089 Other allergic rhinitis: Secondary | ICD-10-CM | POA: Diagnosis not present

## 2017-06-24 DIAGNOSIS — J301 Allergic rhinitis due to pollen: Secondary | ICD-10-CM | POA: Diagnosis not present

## 2017-07-01 DIAGNOSIS — J3089 Other allergic rhinitis: Secondary | ICD-10-CM | POA: Diagnosis not present

## 2017-07-01 DIAGNOSIS — J301 Allergic rhinitis due to pollen: Secondary | ICD-10-CM | POA: Diagnosis not present

## 2017-07-06 DIAGNOSIS — J301 Allergic rhinitis due to pollen: Secondary | ICD-10-CM | POA: Diagnosis not present

## 2017-07-06 DIAGNOSIS — J3089 Other allergic rhinitis: Secondary | ICD-10-CM | POA: Diagnosis not present

## 2017-07-14 DIAGNOSIS — J3089 Other allergic rhinitis: Secondary | ICD-10-CM | POA: Diagnosis not present

## 2017-07-14 DIAGNOSIS — J301 Allergic rhinitis due to pollen: Secondary | ICD-10-CM | POA: Diagnosis not present

## 2017-07-22 DIAGNOSIS — J301 Allergic rhinitis due to pollen: Secondary | ICD-10-CM | POA: Diagnosis not present

## 2017-07-22 DIAGNOSIS — J3089 Other allergic rhinitis: Secondary | ICD-10-CM | POA: Diagnosis not present

## 2017-08-05 DIAGNOSIS — J3089 Other allergic rhinitis: Secondary | ICD-10-CM | POA: Diagnosis not present

## 2017-08-05 DIAGNOSIS — J301 Allergic rhinitis due to pollen: Secondary | ICD-10-CM | POA: Diagnosis not present

## 2017-08-20 DIAGNOSIS — J3089 Other allergic rhinitis: Secondary | ICD-10-CM | POA: Diagnosis not present

## 2017-08-20 DIAGNOSIS — J301 Allergic rhinitis due to pollen: Secondary | ICD-10-CM | POA: Diagnosis not present

## 2017-08-27 DIAGNOSIS — J301 Allergic rhinitis due to pollen: Secondary | ICD-10-CM | POA: Diagnosis not present

## 2017-08-27 DIAGNOSIS — J3089 Other allergic rhinitis: Secondary | ICD-10-CM | POA: Diagnosis not present

## 2017-09-04 DIAGNOSIS — J301 Allergic rhinitis due to pollen: Secondary | ICD-10-CM | POA: Diagnosis not present

## 2017-09-04 DIAGNOSIS — J3089 Other allergic rhinitis: Secondary | ICD-10-CM | POA: Diagnosis not present

## 2017-09-16 DIAGNOSIS — J3089 Other allergic rhinitis: Secondary | ICD-10-CM | POA: Diagnosis not present

## 2017-09-16 DIAGNOSIS — J301 Allergic rhinitis due to pollen: Secondary | ICD-10-CM | POA: Diagnosis not present

## 2017-09-23 DIAGNOSIS — J3089 Other allergic rhinitis: Secondary | ICD-10-CM | POA: Diagnosis not present

## 2017-09-23 DIAGNOSIS — J301 Allergic rhinitis due to pollen: Secondary | ICD-10-CM | POA: Diagnosis not present

## 2017-09-30 DIAGNOSIS — J3089 Other allergic rhinitis: Secondary | ICD-10-CM | POA: Diagnosis not present

## 2017-09-30 DIAGNOSIS — J301 Allergic rhinitis due to pollen: Secondary | ICD-10-CM | POA: Diagnosis not present

## 2017-10-07 DIAGNOSIS — J3089 Other allergic rhinitis: Secondary | ICD-10-CM | POA: Diagnosis not present

## 2017-10-07 DIAGNOSIS — J301 Allergic rhinitis due to pollen: Secondary | ICD-10-CM | POA: Diagnosis not present

## 2017-10-14 DIAGNOSIS — J3089 Other allergic rhinitis: Secondary | ICD-10-CM | POA: Diagnosis not present

## 2017-10-14 DIAGNOSIS — J301 Allergic rhinitis due to pollen: Secondary | ICD-10-CM | POA: Diagnosis not present

## 2017-10-23 DIAGNOSIS — J3089 Other allergic rhinitis: Secondary | ICD-10-CM | POA: Diagnosis not present

## 2017-10-23 DIAGNOSIS — J301 Allergic rhinitis due to pollen: Secondary | ICD-10-CM | POA: Diagnosis not present

## 2017-10-30 DIAGNOSIS — Z23 Encounter for immunization: Secondary | ICD-10-CM | POA: Diagnosis not present

## 2017-10-30 DIAGNOSIS — G4700 Insomnia, unspecified: Secondary | ICD-10-CM | POA: Diagnosis not present

## 2017-11-02 DIAGNOSIS — J301 Allergic rhinitis due to pollen: Secondary | ICD-10-CM | POA: Diagnosis not present

## 2017-11-02 DIAGNOSIS — J3089 Other allergic rhinitis: Secondary | ICD-10-CM | POA: Diagnosis not present

## 2017-11-04 DIAGNOSIS — L814 Other melanin hyperpigmentation: Secondary | ICD-10-CM | POA: Diagnosis not present

## 2017-11-04 DIAGNOSIS — D225 Melanocytic nevi of trunk: Secondary | ICD-10-CM | POA: Diagnosis not present

## 2017-11-11 DIAGNOSIS — J301 Allergic rhinitis due to pollen: Secondary | ICD-10-CM | POA: Diagnosis not present

## 2017-11-11 DIAGNOSIS — J3089 Other allergic rhinitis: Secondary | ICD-10-CM | POA: Diagnosis not present

## 2017-11-13 DIAGNOSIS — J301 Allergic rhinitis due to pollen: Secondary | ICD-10-CM | POA: Diagnosis not present

## 2017-11-20 DIAGNOSIS — J301 Allergic rhinitis due to pollen: Secondary | ICD-10-CM | POA: Diagnosis not present

## 2017-11-20 DIAGNOSIS — J3089 Other allergic rhinitis: Secondary | ICD-10-CM | POA: Diagnosis not present

## 2017-11-25 DIAGNOSIS — J3089 Other allergic rhinitis: Secondary | ICD-10-CM | POA: Diagnosis not present

## 2017-11-25 DIAGNOSIS — J301 Allergic rhinitis due to pollen: Secondary | ICD-10-CM | POA: Diagnosis not present

## 2017-11-27 DIAGNOSIS — J3089 Other allergic rhinitis: Secondary | ICD-10-CM | POA: Diagnosis not present

## 2017-11-27 DIAGNOSIS — J301 Allergic rhinitis due to pollen: Secondary | ICD-10-CM | POA: Diagnosis not present

## 2017-12-03 DIAGNOSIS — J3089 Other allergic rhinitis: Secondary | ICD-10-CM | POA: Diagnosis not present

## 2017-12-03 DIAGNOSIS — J301 Allergic rhinitis due to pollen: Secondary | ICD-10-CM | POA: Diagnosis not present

## 2017-12-10 DIAGNOSIS — J301 Allergic rhinitis due to pollen: Secondary | ICD-10-CM | POA: Diagnosis not present

## 2017-12-10 DIAGNOSIS — J3089 Other allergic rhinitis: Secondary | ICD-10-CM | POA: Diagnosis not present

## 2017-12-17 DIAGNOSIS — J3089 Other allergic rhinitis: Secondary | ICD-10-CM | POA: Diagnosis not present

## 2017-12-17 DIAGNOSIS — J301 Allergic rhinitis due to pollen: Secondary | ICD-10-CM | POA: Diagnosis not present

## 2017-12-23 DIAGNOSIS — J3089 Other allergic rhinitis: Secondary | ICD-10-CM | POA: Diagnosis not present

## 2017-12-23 DIAGNOSIS — J301 Allergic rhinitis due to pollen: Secondary | ICD-10-CM | POA: Diagnosis not present

## 2017-12-30 DIAGNOSIS — J3089 Other allergic rhinitis: Secondary | ICD-10-CM | POA: Diagnosis not present

## 2017-12-30 DIAGNOSIS — J301 Allergic rhinitis due to pollen: Secondary | ICD-10-CM | POA: Diagnosis not present

## 2018-01-07 DIAGNOSIS — J301 Allergic rhinitis due to pollen: Secondary | ICD-10-CM | POA: Diagnosis not present

## 2018-01-07 DIAGNOSIS — J3089 Other allergic rhinitis: Secondary | ICD-10-CM | POA: Diagnosis not present

## 2018-01-18 DIAGNOSIS — J3089 Other allergic rhinitis: Secondary | ICD-10-CM | POA: Diagnosis not present

## 2018-01-18 DIAGNOSIS — J301 Allergic rhinitis due to pollen: Secondary | ICD-10-CM | POA: Diagnosis not present

## 2018-01-25 DIAGNOSIS — J301 Allergic rhinitis due to pollen: Secondary | ICD-10-CM | POA: Diagnosis not present

## 2018-01-25 DIAGNOSIS — J3089 Other allergic rhinitis: Secondary | ICD-10-CM | POA: Diagnosis not present

## 2018-01-25 DIAGNOSIS — J4540 Moderate persistent asthma, uncomplicated: Secondary | ICD-10-CM | POA: Diagnosis not present

## 2018-02-04 DIAGNOSIS — J3089 Other allergic rhinitis: Secondary | ICD-10-CM | POA: Diagnosis not present

## 2018-02-04 DIAGNOSIS — J301 Allergic rhinitis due to pollen: Secondary | ICD-10-CM | POA: Diagnosis not present

## 2018-02-09 ENCOUNTER — Ambulatory Visit: Payer: BLUE CROSS/BLUE SHIELD | Admitting: Psychology

## 2018-02-09 DIAGNOSIS — F4320 Adjustment disorder, unspecified: Secondary | ICD-10-CM

## 2018-02-10 ENCOUNTER — Ambulatory Visit: Payer: BLUE CROSS/BLUE SHIELD | Admitting: Psychology

## 2018-02-16 DIAGNOSIS — Z0000 Encounter for general adult medical examination without abnormal findings: Secondary | ICD-10-CM | POA: Diagnosis not present

## 2018-02-16 DIAGNOSIS — E7800 Pure hypercholesterolemia, unspecified: Secondary | ICD-10-CM | POA: Diagnosis not present

## 2018-02-16 DIAGNOSIS — J301 Allergic rhinitis due to pollen: Secondary | ICD-10-CM | POA: Diagnosis not present

## 2018-02-16 DIAGNOSIS — Z23 Encounter for immunization: Secondary | ICD-10-CM | POA: Diagnosis not present

## 2018-02-16 DIAGNOSIS — Z79899 Other long term (current) drug therapy: Secondary | ICD-10-CM | POA: Diagnosis not present

## 2018-02-16 DIAGNOSIS — J3089 Other allergic rhinitis: Secondary | ICD-10-CM | POA: Diagnosis not present

## 2018-02-17 DIAGNOSIS — Z1211 Encounter for screening for malignant neoplasm of colon: Secondary | ICD-10-CM | POA: Diagnosis not present

## 2018-02-24 ENCOUNTER — Ambulatory Visit (INDEPENDENT_AMBULATORY_CARE_PROVIDER_SITE_OTHER): Payer: BLUE CROSS/BLUE SHIELD | Admitting: Psychology

## 2018-02-24 DIAGNOSIS — F4320 Adjustment disorder, unspecified: Secondary | ICD-10-CM

## 2018-03-03 DIAGNOSIS — J3089 Other allergic rhinitis: Secondary | ICD-10-CM | POA: Diagnosis not present

## 2018-03-03 DIAGNOSIS — J301 Allergic rhinitis due to pollen: Secondary | ICD-10-CM | POA: Diagnosis not present

## 2018-03-10 ENCOUNTER — Ambulatory Visit: Payer: BLUE CROSS/BLUE SHIELD | Admitting: Psychology

## 2018-03-17 DIAGNOSIS — J301 Allergic rhinitis due to pollen: Secondary | ICD-10-CM | POA: Diagnosis not present

## 2018-03-17 DIAGNOSIS — J3089 Other allergic rhinitis: Secondary | ICD-10-CM | POA: Diagnosis not present

## 2018-04-09 ENCOUNTER — Ambulatory Visit (INDEPENDENT_AMBULATORY_CARE_PROVIDER_SITE_OTHER): Payer: BLUE CROSS/BLUE SHIELD | Admitting: Psychology

## 2018-04-09 DIAGNOSIS — F4320 Adjustment disorder, unspecified: Secondary | ICD-10-CM

## 2018-04-13 DIAGNOSIS — J301 Allergic rhinitis due to pollen: Secondary | ICD-10-CM | POA: Diagnosis not present

## 2018-04-13 DIAGNOSIS — J3089 Other allergic rhinitis: Secondary | ICD-10-CM | POA: Diagnosis not present

## 2018-04-27 DIAGNOSIS — J301 Allergic rhinitis due to pollen: Secondary | ICD-10-CM | POA: Diagnosis not present

## 2018-04-27 DIAGNOSIS — J3089 Other allergic rhinitis: Secondary | ICD-10-CM | POA: Diagnosis not present

## 2018-05-11 DIAGNOSIS — J301 Allergic rhinitis due to pollen: Secondary | ICD-10-CM | POA: Diagnosis not present

## 2018-05-11 DIAGNOSIS — J3089 Other allergic rhinitis: Secondary | ICD-10-CM | POA: Diagnosis not present

## 2018-05-28 ENCOUNTER — Ambulatory Visit: Payer: BLUE CROSS/BLUE SHIELD | Admitting: Psychology

## 2018-05-28 DIAGNOSIS — J3089 Other allergic rhinitis: Secondary | ICD-10-CM | POA: Diagnosis not present

## 2018-05-28 DIAGNOSIS — F4320 Adjustment disorder, unspecified: Secondary | ICD-10-CM

## 2018-05-28 DIAGNOSIS — J301 Allergic rhinitis due to pollen: Secondary | ICD-10-CM | POA: Diagnosis not present

## 2018-06-10 DIAGNOSIS — J301 Allergic rhinitis due to pollen: Secondary | ICD-10-CM | POA: Diagnosis not present

## 2018-06-10 DIAGNOSIS — J3089 Other allergic rhinitis: Secondary | ICD-10-CM | POA: Diagnosis not present

## 2018-06-15 ENCOUNTER — Ambulatory Visit: Payer: BLUE CROSS/BLUE SHIELD | Admitting: Psychology

## 2018-06-15 DIAGNOSIS — F4320 Adjustment disorder, unspecified: Secondary | ICD-10-CM

## 2018-06-23 DIAGNOSIS — J301 Allergic rhinitis due to pollen: Secondary | ICD-10-CM | POA: Diagnosis not present

## 2018-06-23 DIAGNOSIS — J3089 Other allergic rhinitis: Secondary | ICD-10-CM | POA: Diagnosis not present

## 2018-06-28 DIAGNOSIS — J3089 Other allergic rhinitis: Secondary | ICD-10-CM | POA: Diagnosis not present

## 2018-06-28 DIAGNOSIS — J301 Allergic rhinitis due to pollen: Secondary | ICD-10-CM | POA: Diagnosis not present

## 2018-07-09 DIAGNOSIS — J301 Allergic rhinitis due to pollen: Secondary | ICD-10-CM | POA: Diagnosis not present

## 2018-07-09 DIAGNOSIS — J3089 Other allergic rhinitis: Secondary | ICD-10-CM | POA: Diagnosis not present

## 2018-07-23 ENCOUNTER — Ambulatory Visit: Payer: BLUE CROSS/BLUE SHIELD | Admitting: Psychology

## 2018-07-23 DIAGNOSIS — F4320 Adjustment disorder, unspecified: Secondary | ICD-10-CM

## 2018-07-23 DIAGNOSIS — J3089 Other allergic rhinitis: Secondary | ICD-10-CM | POA: Diagnosis not present

## 2018-07-23 DIAGNOSIS — J301 Allergic rhinitis due to pollen: Secondary | ICD-10-CM | POA: Diagnosis not present

## 2018-07-27 DIAGNOSIS — J301 Allergic rhinitis due to pollen: Secondary | ICD-10-CM | POA: Diagnosis not present

## 2018-07-27 DIAGNOSIS — J3089 Other allergic rhinitis: Secondary | ICD-10-CM | POA: Diagnosis not present

## 2018-07-29 DIAGNOSIS — J301 Allergic rhinitis due to pollen: Secondary | ICD-10-CM | POA: Diagnosis not present

## 2018-07-29 DIAGNOSIS — J3089 Other allergic rhinitis: Secondary | ICD-10-CM | POA: Diagnosis not present

## 2018-08-02 DIAGNOSIS — J301 Allergic rhinitis due to pollen: Secondary | ICD-10-CM | POA: Diagnosis not present

## 2018-08-02 DIAGNOSIS — J3089 Other allergic rhinitis: Secondary | ICD-10-CM | POA: Diagnosis not present

## 2018-08-06 DIAGNOSIS — J301 Allergic rhinitis due to pollen: Secondary | ICD-10-CM | POA: Diagnosis not present

## 2018-08-06 DIAGNOSIS — J3089 Other allergic rhinitis: Secondary | ICD-10-CM | POA: Diagnosis not present

## 2018-08-13 ENCOUNTER — Ambulatory Visit: Payer: BLUE CROSS/BLUE SHIELD | Admitting: Psychology

## 2018-08-13 DIAGNOSIS — F4320 Adjustment disorder, unspecified: Secondary | ICD-10-CM

## 2018-08-20 DIAGNOSIS — J3089 Other allergic rhinitis: Secondary | ICD-10-CM | POA: Diagnosis not present

## 2018-08-20 DIAGNOSIS — J301 Allergic rhinitis due to pollen: Secondary | ICD-10-CM | POA: Diagnosis not present

## 2018-09-02 DIAGNOSIS — J3089 Other allergic rhinitis: Secondary | ICD-10-CM | POA: Diagnosis not present

## 2018-09-02 DIAGNOSIS — J301 Allergic rhinitis due to pollen: Secondary | ICD-10-CM | POA: Diagnosis not present

## 2018-09-14 DIAGNOSIS — J301 Allergic rhinitis due to pollen: Secondary | ICD-10-CM | POA: Diagnosis not present

## 2018-09-14 DIAGNOSIS — J3089 Other allergic rhinitis: Secondary | ICD-10-CM | POA: Diagnosis not present

## 2018-09-17 ENCOUNTER — Ambulatory Visit: Payer: BLUE CROSS/BLUE SHIELD | Admitting: Psychology

## 2018-09-17 DIAGNOSIS — F4320 Adjustment disorder, unspecified: Secondary | ICD-10-CM

## 2018-10-01 DIAGNOSIS — J3089 Other allergic rhinitis: Secondary | ICD-10-CM | POA: Diagnosis not present

## 2018-10-01 DIAGNOSIS — J301 Allergic rhinitis due to pollen: Secondary | ICD-10-CM | POA: Diagnosis not present

## 2018-10-14 DIAGNOSIS — J3089 Other allergic rhinitis: Secondary | ICD-10-CM | POA: Diagnosis not present

## 2018-10-14 DIAGNOSIS — J301 Allergic rhinitis due to pollen: Secondary | ICD-10-CM | POA: Diagnosis not present

## 2018-10-27 DIAGNOSIS — J301 Allergic rhinitis due to pollen: Secondary | ICD-10-CM | POA: Diagnosis not present

## 2018-10-27 DIAGNOSIS — J3089 Other allergic rhinitis: Secondary | ICD-10-CM | POA: Diagnosis not present

## 2018-11-04 DIAGNOSIS — D229 Melanocytic nevi, unspecified: Secondary | ICD-10-CM | POA: Diagnosis not present

## 2018-11-04 DIAGNOSIS — D1801 Hemangioma of skin and subcutaneous tissue: Secondary | ICD-10-CM | POA: Diagnosis not present

## 2018-11-04 DIAGNOSIS — L814 Other melanin hyperpigmentation: Secondary | ICD-10-CM | POA: Diagnosis not present

## 2018-11-04 DIAGNOSIS — L821 Other seborrheic keratosis: Secondary | ICD-10-CM | POA: Diagnosis not present

## 2018-11-10 DIAGNOSIS — J301 Allergic rhinitis due to pollen: Secondary | ICD-10-CM | POA: Diagnosis not present

## 2018-11-10 DIAGNOSIS — J3089 Other allergic rhinitis: Secondary | ICD-10-CM | POA: Diagnosis not present

## 2018-11-26 DIAGNOSIS — I10 Essential (primary) hypertension: Secondary | ICD-10-CM | POA: Diagnosis not present

## 2018-11-26 DIAGNOSIS — G4700 Insomnia, unspecified: Secondary | ICD-10-CM | POA: Diagnosis not present

## 2018-11-26 DIAGNOSIS — J3089 Other allergic rhinitis: Secondary | ICD-10-CM | POA: Diagnosis not present

## 2018-11-26 DIAGNOSIS — M7660 Achilles tendinitis, unspecified leg: Secondary | ICD-10-CM | POA: Diagnosis not present

## 2018-11-26 DIAGNOSIS — J301 Allergic rhinitis due to pollen: Secondary | ICD-10-CM | POA: Diagnosis not present

## 2018-12-03 DIAGNOSIS — J301 Allergic rhinitis due to pollen: Secondary | ICD-10-CM | POA: Diagnosis not present

## 2018-12-03 DIAGNOSIS — J3089 Other allergic rhinitis: Secondary | ICD-10-CM | POA: Diagnosis not present

## 2018-12-21 DIAGNOSIS — J301 Allergic rhinitis due to pollen: Secondary | ICD-10-CM | POA: Diagnosis not present

## 2018-12-21 DIAGNOSIS — J3089 Other allergic rhinitis: Secondary | ICD-10-CM | POA: Diagnosis not present

## 2019-01-05 DIAGNOSIS — J301 Allergic rhinitis due to pollen: Secondary | ICD-10-CM | POA: Diagnosis not present

## 2019-01-05 DIAGNOSIS — J3089 Other allergic rhinitis: Secondary | ICD-10-CM | POA: Diagnosis not present

## 2019-01-21 ENCOUNTER — Ambulatory Visit (INDEPENDENT_AMBULATORY_CARE_PROVIDER_SITE_OTHER): Payer: BLUE CROSS/BLUE SHIELD | Admitting: Psychology

## 2019-01-21 DIAGNOSIS — F4320 Adjustment disorder, unspecified: Secondary | ICD-10-CM | POA: Diagnosis not present

## 2019-01-25 DIAGNOSIS — J4540 Moderate persistent asthma, uncomplicated: Secondary | ICD-10-CM | POA: Diagnosis not present

## 2019-01-25 DIAGNOSIS — J3089 Other allergic rhinitis: Secondary | ICD-10-CM | POA: Diagnosis not present

## 2019-01-25 DIAGNOSIS — J301 Allergic rhinitis due to pollen: Secondary | ICD-10-CM | POA: Diagnosis not present

## 2019-01-31 DIAGNOSIS — J3089 Other allergic rhinitis: Secondary | ICD-10-CM | POA: Diagnosis not present

## 2019-01-31 DIAGNOSIS — J301 Allergic rhinitis due to pollen: Secondary | ICD-10-CM | POA: Diagnosis not present

## 2019-02-15 DIAGNOSIS — J3089 Other allergic rhinitis: Secondary | ICD-10-CM | POA: Diagnosis not present

## 2019-02-15 DIAGNOSIS — J301 Allergic rhinitis due to pollen: Secondary | ICD-10-CM | POA: Diagnosis not present

## 2019-02-18 DIAGNOSIS — J301 Allergic rhinitis due to pollen: Secondary | ICD-10-CM | POA: Diagnosis not present

## 2019-02-18 DIAGNOSIS — J3089 Other allergic rhinitis: Secondary | ICD-10-CM | POA: Diagnosis not present

## 2019-03-11 ENCOUNTER — Ambulatory Visit: Payer: BLUE CROSS/BLUE SHIELD | Admitting: Psychology

## 2019-04-11 DIAGNOSIS — G4700 Insomnia, unspecified: Secondary | ICD-10-CM | POA: Diagnosis not present

## 2019-05-05 DIAGNOSIS — J301 Allergic rhinitis due to pollen: Secondary | ICD-10-CM | POA: Diagnosis not present

## 2019-05-05 DIAGNOSIS — J3089 Other allergic rhinitis: Secondary | ICD-10-CM | POA: Diagnosis not present

## 2019-05-10 DIAGNOSIS — J3089 Other allergic rhinitis: Secondary | ICD-10-CM | POA: Diagnosis not present

## 2019-05-10 DIAGNOSIS — J301 Allergic rhinitis due to pollen: Secondary | ICD-10-CM | POA: Diagnosis not present

## 2019-05-13 DIAGNOSIS — J3089 Other allergic rhinitis: Secondary | ICD-10-CM | POA: Diagnosis not present

## 2019-05-13 DIAGNOSIS — J301 Allergic rhinitis due to pollen: Secondary | ICD-10-CM | POA: Diagnosis not present

## 2019-05-19 DIAGNOSIS — J3089 Other allergic rhinitis: Secondary | ICD-10-CM | POA: Diagnosis not present

## 2019-05-19 DIAGNOSIS — J301 Allergic rhinitis due to pollen: Secondary | ICD-10-CM | POA: Diagnosis not present

## 2019-06-02 DIAGNOSIS — J301 Allergic rhinitis due to pollen: Secondary | ICD-10-CM | POA: Diagnosis not present

## 2019-06-02 DIAGNOSIS — J3089 Other allergic rhinitis: Secondary | ICD-10-CM | POA: Diagnosis not present

## 2019-06-13 DIAGNOSIS — J3089 Other allergic rhinitis: Secondary | ICD-10-CM | POA: Diagnosis not present

## 2019-06-13 DIAGNOSIS — J301 Allergic rhinitis due to pollen: Secondary | ICD-10-CM | POA: Diagnosis not present

## 2019-06-20 DIAGNOSIS — J301 Allergic rhinitis due to pollen: Secondary | ICD-10-CM | POA: Diagnosis not present

## 2019-06-20 DIAGNOSIS — J3089 Other allergic rhinitis: Secondary | ICD-10-CM | POA: Diagnosis not present

## 2019-06-23 DIAGNOSIS — J3089 Other allergic rhinitis: Secondary | ICD-10-CM | POA: Diagnosis not present

## 2019-06-23 DIAGNOSIS — J301 Allergic rhinitis due to pollen: Secondary | ICD-10-CM | POA: Diagnosis not present

## 2019-06-28 DIAGNOSIS — J301 Allergic rhinitis due to pollen: Secondary | ICD-10-CM | POA: Diagnosis not present

## 2019-06-28 DIAGNOSIS — J3089 Other allergic rhinitis: Secondary | ICD-10-CM | POA: Diagnosis not present

## 2019-07-01 DIAGNOSIS — J3089 Other allergic rhinitis: Secondary | ICD-10-CM | POA: Diagnosis not present

## 2019-07-01 DIAGNOSIS — J301 Allergic rhinitis due to pollen: Secondary | ICD-10-CM | POA: Diagnosis not present

## 2019-07-05 DIAGNOSIS — J301 Allergic rhinitis due to pollen: Secondary | ICD-10-CM | POA: Diagnosis not present

## 2019-07-05 DIAGNOSIS — J3089 Other allergic rhinitis: Secondary | ICD-10-CM | POA: Diagnosis not present

## 2019-07-07 DIAGNOSIS — M7662 Achilles tendinitis, left leg: Secondary | ICD-10-CM | POA: Diagnosis not present

## 2019-07-07 DIAGNOSIS — M7661 Achilles tendinitis, right leg: Secondary | ICD-10-CM | POA: Diagnosis not present

## 2019-07-08 DIAGNOSIS — M7661 Achilles tendinitis, right leg: Secondary | ICD-10-CM | POA: Diagnosis not present

## 2019-07-08 DIAGNOSIS — M7662 Achilles tendinitis, left leg: Secondary | ICD-10-CM | POA: Diagnosis not present

## 2019-07-12 DIAGNOSIS — M7661 Achilles tendinitis, right leg: Secondary | ICD-10-CM | POA: Diagnosis not present

## 2019-07-12 DIAGNOSIS — M7662 Achilles tendinitis, left leg: Secondary | ICD-10-CM | POA: Diagnosis not present

## 2019-07-14 DIAGNOSIS — M7661 Achilles tendinitis, right leg: Secondary | ICD-10-CM | POA: Diagnosis not present

## 2019-07-14 DIAGNOSIS — M7662 Achilles tendinitis, left leg: Secondary | ICD-10-CM | POA: Diagnosis not present

## 2019-07-18 DIAGNOSIS — J3089 Other allergic rhinitis: Secondary | ICD-10-CM | POA: Diagnosis not present

## 2019-07-18 DIAGNOSIS — J301 Allergic rhinitis due to pollen: Secondary | ICD-10-CM | POA: Diagnosis not present

## 2019-07-19 DIAGNOSIS — M7661 Achilles tendinitis, right leg: Secondary | ICD-10-CM | POA: Diagnosis not present

## 2019-07-19 DIAGNOSIS — M7662 Achilles tendinitis, left leg: Secondary | ICD-10-CM | POA: Diagnosis not present

## 2019-07-21 DIAGNOSIS — M7662 Achilles tendinitis, left leg: Secondary | ICD-10-CM | POA: Diagnosis not present

## 2019-07-21 DIAGNOSIS — M7661 Achilles tendinitis, right leg: Secondary | ICD-10-CM | POA: Diagnosis not present

## 2019-07-26 DIAGNOSIS — M7661 Achilles tendinitis, right leg: Secondary | ICD-10-CM | POA: Diagnosis not present

## 2019-07-26 DIAGNOSIS — M7662 Achilles tendinitis, left leg: Secondary | ICD-10-CM | POA: Diagnosis not present

## 2019-07-28 DIAGNOSIS — M7661 Achilles tendinitis, right leg: Secondary | ICD-10-CM | POA: Diagnosis not present

## 2019-07-28 DIAGNOSIS — M7662 Achilles tendinitis, left leg: Secondary | ICD-10-CM | POA: Diagnosis not present

## 2019-08-01 DIAGNOSIS — J301 Allergic rhinitis due to pollen: Secondary | ICD-10-CM | POA: Diagnosis not present

## 2019-08-01 DIAGNOSIS — J3089 Other allergic rhinitis: Secondary | ICD-10-CM | POA: Diagnosis not present

## 2019-08-02 DIAGNOSIS — M7661 Achilles tendinitis, right leg: Secondary | ICD-10-CM | POA: Diagnosis not present

## 2019-08-02 DIAGNOSIS — M7662 Achilles tendinitis, left leg: Secondary | ICD-10-CM | POA: Diagnosis not present

## 2019-08-03 ENCOUNTER — Other Ambulatory Visit: Payer: Self-pay | Admitting: Orthopaedic Surgery

## 2019-08-03 DIAGNOSIS — S86012A Strain of left Achilles tendon, initial encounter: Secondary | ICD-10-CM | POA: Diagnosis not present

## 2019-08-04 ENCOUNTER — Other Ambulatory Visit: Payer: Self-pay

## 2019-08-04 ENCOUNTER — Encounter (HOSPITAL_BASED_OUTPATIENT_CLINIC_OR_DEPARTMENT_OTHER): Payer: Self-pay | Admitting: *Deleted

## 2019-08-05 ENCOUNTER — Other Ambulatory Visit (HOSPITAL_COMMUNITY)
Admission: RE | Admit: 2019-08-05 | Discharge: 2019-08-05 | Disposition: A | Discharge status: Home / Self Care | Payer: BC Managed Care – PPO | Source: Ambulatory Visit | Attending: Orthopaedic Surgery | Admitting: Orthopaedic Surgery

## 2019-08-05 DIAGNOSIS — Z01812 Encounter for preprocedural laboratory examination: Secondary | ICD-10-CM | POA: Insufficient documentation

## 2019-08-05 DIAGNOSIS — Z20828 Contact with and (suspected) exposure to other viral communicable diseases: Secondary | ICD-10-CM | POA: Diagnosis not present

## 2019-08-05 LAB — SARS CORONAVIRUS 2 (TAT 6-24 HRS): SARS Coronavirus 2: NEGATIVE

## 2019-08-09 ENCOUNTER — Ambulatory Visit (HOSPITAL_BASED_OUTPATIENT_CLINIC_OR_DEPARTMENT_OTHER): Payer: BC Managed Care – PPO | Admitting: Anesthesiology

## 2019-08-09 ENCOUNTER — Encounter (HOSPITAL_BASED_OUTPATIENT_CLINIC_OR_DEPARTMENT_OTHER): Payer: Self-pay

## 2019-08-09 ENCOUNTER — Encounter (HOSPITAL_BASED_OUTPATIENT_CLINIC_OR_DEPARTMENT_OTHER)
Admission: RE | Disposition: A | Discharge status: Home / Self Care | Payer: Self-pay | Source: Home / Self Care | Attending: Orthopaedic Surgery

## 2019-08-09 ENCOUNTER — Ambulatory Visit (HOSPITAL_BASED_OUTPATIENT_CLINIC_OR_DEPARTMENT_OTHER)
Admission: RE | Admit: 2019-08-09 | Discharge: 2019-08-09 | Disposition: A | Discharge status: Home / Self Care | Payer: BC Managed Care – PPO | Attending: Orthopaedic Surgery | Admitting: Orthopaedic Surgery

## 2019-08-09 ENCOUNTER — Other Ambulatory Visit: Payer: Self-pay

## 2019-08-09 DIAGNOSIS — J45909 Unspecified asthma, uncomplicated: Secondary | ICD-10-CM | POA: Insufficient documentation

## 2019-08-09 DIAGNOSIS — G8918 Other acute postprocedural pain: Secondary | ICD-10-CM | POA: Diagnosis not present

## 2019-08-09 DIAGNOSIS — S86012A Strain of left Achilles tendon, initial encounter: Secondary | ICD-10-CM | POA: Diagnosis not present

## 2019-08-09 DIAGNOSIS — M109 Gout, unspecified: Secondary | ICD-10-CM | POA: Insufficient documentation

## 2019-08-09 DIAGNOSIS — M7662 Achilles tendinitis, left leg: Secondary | ICD-10-CM | POA: Diagnosis not present

## 2019-08-09 DIAGNOSIS — M898X7 Other specified disorders of bone, ankle and foot: Secondary | ICD-10-CM | POA: Diagnosis not present

## 2019-08-09 DIAGNOSIS — Z791 Long term (current) use of non-steroidal anti-inflammatories (NSAID): Secondary | ICD-10-CM | POA: Insufficient documentation

## 2019-08-09 DIAGNOSIS — Z7982 Long term (current) use of aspirin: Secondary | ICD-10-CM | POA: Diagnosis not present

## 2019-08-09 DIAGNOSIS — X58XXXA Exposure to other specified factors, initial encounter: Secondary | ICD-10-CM | POA: Insufficient documentation

## 2019-08-09 DIAGNOSIS — M66862 Spontaneous rupture of other tendons, left lower leg: Secondary | ICD-10-CM | POA: Diagnosis not present

## 2019-08-09 DIAGNOSIS — M7732 Calcaneal spur, left foot: Secondary | ICD-10-CM | POA: Diagnosis not present

## 2019-08-09 HISTORY — DX: Personal history of urinary calculi: Z87.442

## 2019-08-09 HISTORY — DX: Unspecified asthma, uncomplicated: J45.909

## 2019-08-09 HISTORY — DX: Pneumonia, unspecified organism: J18.9

## 2019-08-09 HISTORY — PX: EXCISION HAGLUND'S DEFORMITY WITH ACHILLES TENDON REPAIR: SHX5627

## 2019-08-09 HISTORY — DX: Gout, unspecified: M10.9

## 2019-08-09 SURGERY — EXCISION HAGLUND'S DEFORMITY WITH ACHILLES TENDON REPAIR
Anesthesia: General | Site: Ankle | Laterality: Left

## 2019-08-09 MED ORDER — PROPOFOL 10 MG/ML IV BOLUS
INTRAVENOUS | Status: DC | PRN
  Administered 2019-08-09: 200 mg via INTRAVENOUS

## 2019-08-09 MED ORDER — CEFAZOLIN SODIUM-DEXTROSE 2-4 GM/100ML-% IV SOLN
2.0000 g | INTRAVENOUS | Status: AC
  Administered 2019-08-09: 2 g via INTRAVENOUS

## 2019-08-09 MED ORDER — ONDANSETRON HCL 4 MG PO TABS: 4.0000 mg | ORAL_TABLET | Freq: Three times a day (TID) | ORAL | 1 refills | Status: AC | PRN

## 2019-08-09 MED ORDER — METOCLOPRAMIDE HCL 5 MG/ML IJ SOLN: 10.0000 mg | Freq: Once | INTRAMUSCULAR | Status: DC | PRN

## 2019-08-09 MED ORDER — FENTANYL CITRATE (PF) 250 MCG/5ML IJ SOLN
INTRAMUSCULAR | Status: DC | PRN
  Administered 2019-08-09: 100 ug via INTRAVENOUS

## 2019-08-09 MED ORDER — MIDAZOLAM HCL 2 MG/2ML IJ SOLN
1.0000 mg | INTRAMUSCULAR | Status: DC | PRN
  Administered 2019-08-09: 10:00:00 2 mg via INTRAVENOUS

## 2019-08-09 MED ORDER — LACTATED RINGERS IV SOLN: INTRAVENOUS | Status: DC

## 2019-08-09 MED ORDER — LACTATED RINGERS IV SOLN
INTRAVENOUS | Status: DC | PRN
  Administered 2019-08-09 (×2): via INTRAVENOUS

## 2019-08-09 MED ORDER — FENTANYL CITRATE (PF) 100 MCG/2ML IJ SOLN: 25.0000 ug | INTRAMUSCULAR | Status: DC | PRN

## 2019-08-09 MED ORDER — SUGAMMADEX SODIUM 500 MG/5ML IV SOLN
INTRAVENOUS | Status: AC
  Filled 2019-08-09: qty 5

## 2019-08-09 MED ORDER — MIDAZOLAM HCL 2 MG/2ML IJ SOLN
INTRAMUSCULAR | Status: AC
  Filled 2019-08-09: qty 2

## 2019-08-09 MED ORDER — FENTANYL CITRATE (PF) 100 MCG/2ML IJ SOLN
50.0000 ug | INTRAMUSCULAR | Status: DC | PRN
  Administered 2019-08-09: 100 ug via INTRAVENOUS

## 2019-08-09 MED ORDER — PHENYLEPHRINE HCL (PRESSORS) 10 MG/ML IV SOLN
INTRAVENOUS | Status: DC | PRN
  Administered 2019-08-09: 120 ug via INTRAVENOUS
  Administered 2019-08-09: 80 ug via INTRAVENOUS

## 2019-08-09 MED ORDER — CLONIDINE HCL (ANALGESIA) 100 MCG/ML EP SOLN
EPIDURAL | Status: DC | PRN
  Administered 2019-08-09: 100 ug

## 2019-08-09 MED ORDER — OXYCODONE HCL 5 MG PO TABS: 5.0000 mg | ORAL_TABLET | ORAL | 0 refills | Status: AC | PRN

## 2019-08-09 MED ORDER — ROPIVACAINE HCL 5 MG/ML IJ SOLN
INTRAMUSCULAR | Status: DC | PRN
  Administered 2019-08-09: 30 mL via EPIDURAL

## 2019-08-09 MED ORDER — POVIDONE-IODINE 10 % EX SWAB: 2.0000 "application " | Freq: Once | CUTANEOUS | Status: DC

## 2019-08-09 MED ORDER — MIDAZOLAM HCL 5 MG/5ML IJ SOLN
INTRAMUSCULAR | Status: DC | PRN
  Administered 2019-08-09: 2 mg via INTRAVENOUS

## 2019-08-09 MED ORDER — ASPIRIN 325 MG PO TABS: 325.0000 mg | ORAL_TABLET | Freq: Every day | ORAL | 0 refills | Status: AC

## 2019-08-09 MED ORDER — FENTANYL CITRATE (PF) 100 MCG/2ML IJ SOLN
INTRAMUSCULAR | Status: AC
  Filled 2019-08-09: qty 2

## 2019-08-09 MED ORDER — LIDOCAINE HCL (CARDIAC) PF 100 MG/5ML IV SOSY
PREFILLED_SYRINGE | INTRAVENOUS | Status: DC | PRN
  Administered 2019-08-09: 60 mg via INTRATRACHEAL

## 2019-08-09 MED ORDER — ROCURONIUM BROMIDE 100 MG/10ML IV SOLN
INTRAVENOUS | Status: DC | PRN
  Administered 2019-08-09: 100 mg via INTRAVENOUS

## 2019-08-09 MED ORDER — CEFAZOLIN SODIUM-DEXTROSE 2-4 GM/100ML-% IV SOLN
INTRAVENOUS | Status: AC
  Filled 2019-08-09: qty 100

## 2019-08-09 MED ORDER — ONDANSETRON HCL 4 MG/2ML IJ SOLN
INTRAMUSCULAR | Status: DC | PRN
  Administered 2019-08-09: 4 mg via INTRAVENOUS

## 2019-08-09 MED ORDER — SUGAMMADEX SODIUM 200 MG/2ML IV SOLN
INTRAVENOUS | Status: DC | PRN
  Administered 2019-08-09: 300 mg via INTRAVENOUS

## 2019-08-09 MED ORDER — DEXAMETHASONE SODIUM PHOSPHATE 10 MG/ML IJ SOLN
INTRAMUSCULAR | Status: DC | PRN
  Administered 2019-08-09: 10 mg via INTRAVENOUS

## 2019-08-09 MED ORDER — MEPERIDINE HCL 25 MG/ML IJ SOLN: 6.2500 mg | INTRAMUSCULAR | Status: DC | PRN

## 2019-08-09 SURGICAL SUPPLY — 70 items
BANDAGE ESMARK 6X9 LF (GAUZE/BANDAGES/DRESSINGS) ×1 IMPLANT
BENZOIN TINCTURE PRP APPL 2/3 (GAUZE/BANDAGES/DRESSINGS) IMPLANT
BLADE AVERAGE 25X9 (BLADE) IMPLANT
BLADE MICRO SAGITTAL (BLADE) ×2 IMPLANT
BLADE SURG 15 STRL LF DISP TIS (BLADE) ×2 IMPLANT
BLADE SURG 15 STRL SS (BLADE) ×2
BNDG ELASTIC 4X5.8 VLCR STR LF (GAUZE/BANDAGES/DRESSINGS) IMPLANT
BNDG ELASTIC 6X5.8 VLCR STR LF (GAUZE/BANDAGES/DRESSINGS) IMPLANT
BNDG ESMARK 6X9 LF (GAUZE/BANDAGES/DRESSINGS) ×2
CHLORAPREP W/TINT 26 (MISCELLANEOUS) ×2 IMPLANT
COVER BACK TABLE REUSABLE LG (DRAPES) ×2 IMPLANT
COVER WAND RF STERILE (DRAPES) IMPLANT
CUFF TOURN SGL QUICK 34 (TOURNIQUET CUFF)
CUFF TRNQT CYL 34X4.125X (TOURNIQUET CUFF) IMPLANT
DECANTER SPIKE VIAL GLASS SM (MISCELLANEOUS) IMPLANT
DRAPE EXTREMITY T 121X128X90 (DISPOSABLE) ×2 IMPLANT
DRAPE IMP U-DRAPE 54X76 (DRAPES) ×2 IMPLANT
DRAPE OEC MINIVIEW 54X84 (DRAPES) ×2 IMPLANT
DRAPE U-SHAPE 47X51 STRL (DRAPES) ×2 IMPLANT
DRSG PAD ABDOMINAL 8X10 ST (GAUZE/BANDAGES/DRESSINGS) ×2 IMPLANT
ELECT REM PT RETURN 9FT ADLT (ELECTROSURGICAL) ×2
ELECTRODE REM PT RTRN 9FT ADLT (ELECTROSURGICAL) ×1 IMPLANT
GAUZE SPONGE 4X4 12PLY STRL (GAUZE/BANDAGES/DRESSINGS) ×2 IMPLANT
GAUZE XEROFORM 1X8 LF (GAUZE/BANDAGES/DRESSINGS) ×2 IMPLANT
GLOVE BIO SURGEON STRL SZ8 (GLOVE) IMPLANT
GLOVE BIOGEL M STRL SZ7.5 (GLOVE) ×2 IMPLANT
GLOVE BIOGEL PI IND STRL 8 (GLOVE) ×1 IMPLANT
GLOVE BIOGEL PI INDICATOR 8 (GLOVE) ×1
GOWN STRL REUS W/ TWL LRG LVL3 (GOWN DISPOSABLE) ×1 IMPLANT
GOWN STRL REUS W/ TWL XL LVL3 (GOWN DISPOSABLE) ×1 IMPLANT
GOWN STRL REUS W/TWL LRG LVL3 (GOWN DISPOSABLE) ×1
GOWN STRL REUS W/TWL XL LVL3 (GOWN DISPOSABLE) ×1
IMPL SYS BIOCOMP ACH SPEED (Anchor) ×1 IMPLANT
IMPLANT SYS BIOCOMP ACH SPEED (Anchor) ×2 IMPLANT
NDL SUT 6 .5 CRC .975X.05 MAYO (NEEDLE) IMPLANT
NEEDLE HYPO 22GX1.5 SAFETY (NEEDLE) IMPLANT
NEEDLE MAYO TAPER (NEEDLE)
NS IRRIG 1000ML POUR BTL (IV SOLUTION) ×2 IMPLANT
PACK BASIN DAY SURGERY FS (CUSTOM PROCEDURE TRAY) ×2 IMPLANT
PAD CAST 4YDX4 CTTN HI CHSV (CAST SUPPLIES) ×1 IMPLANT
PADDING CAST COTTON 4X4 STRL (CAST SUPPLIES) ×1
PADDING CAST SYNTHETIC 4 (CAST SUPPLIES) ×1
PADDING CAST SYNTHETIC 4X4 STR (CAST SUPPLIES) ×1 IMPLANT
PENCIL BUTTON HOLSTER BLD 10FT (ELECTRODE) ×2 IMPLANT
SCOTCHCAST PLUS 4X4 WHITE (CAST SUPPLIES) ×6 IMPLANT
SLEEVE SCD COMPRESS KNEE MED (MISCELLANEOUS) ×2 IMPLANT
SPLINT FAST PLASTER 5X30 (CAST SUPPLIES) ×20
SPLINT PLASTER CAST FAST 5X30 (CAST SUPPLIES) ×20 IMPLANT
SPONGE LAP 18X18 RF (DISPOSABLE) IMPLANT
STOCKINETTE 6  STRL (DRAPES) ×1
STOCKINETTE 6 STRL (DRAPES) ×1 IMPLANT
STOCKINETTE TUBULAR 6 INCH (GAUZE/BANDAGES/DRESSINGS) ×2 IMPLANT
STRIP CLOSURE SKIN 1/2X4 (GAUZE/BANDAGES/DRESSINGS) IMPLANT
SUCTION FRAZIER HANDLE 10FR (MISCELLANEOUS) ×1
SUCTION TUBE FRAZIER 10FR DISP (MISCELLANEOUS) ×1 IMPLANT
SURGILUBE 2OZ TUBE FLIPTOP (MISCELLANEOUS) IMPLANT
SUT ETHILON 3 0 PS 1 (SUTURE) ×2 IMPLANT
SUT FIBERWIRE #2 38 REV NDL BL (SUTURE)
SUT MNCRL AB 3-0 PS2 18 (SUTURE) ×2 IMPLANT
SUT PDS AB 2-0 CT2 27 (SUTURE) ×2 IMPLANT
SUT VIC AB 0 CT1 27 (SUTURE) ×1
SUT VIC AB 0 CT1 27XBRD ANBCTR (SUTURE) ×1 IMPLANT
SUT VIC AB 0 SH 27 (SUTURE) IMPLANT
SUT VIC AB 2-0 SH 18 (SUTURE) IMPLANT
SUTURE FIBERWR#2 38 REV NDL BL (SUTURE) IMPLANT
SYR BULB 3OZ (MISCELLANEOUS) ×2 IMPLANT
SYR CONTROL 10ML LL (SYRINGE) IMPLANT
TOWEL GREEN STERILE FF (TOWEL DISPOSABLE) ×4 IMPLANT
TUBE CONNECTING 20X1/4 (TUBING) ×2 IMPLANT
UNDERPAD 30X30 (UNDERPADS AND DIAPERS) ×2 IMPLANT

## 2019-08-09 NOTE — Transfer of Care (Signed)
Immediate Anesthesia Transfer of Care Note  Patient: Chris Mosley  Procedure(s) Performed: LEFT ACHILLES RECONSTRUCTION AND DEBRIDEMENT. CALCANEAL EXOSTECTOMY (Left Ankle)  Patient Location: PACU  Anesthesia Type:General  Level of Consciousness: drowsy  Airway & Oxygen Therapy: Patient Spontanous Breathing and Patient connected to face mask oxygen  Post-op Assessment: Report given to RN and Post -op Vital signs reviewed and stable  Post vital signs: Reviewed and stable  Last Vitals:  Vitals Value Taken Time  BP 137/85 08/09/19 1222  Temp    Pulse 61 08/09/19 1227  Resp 17 08/09/19 1227  SpO2 100 % 08/09/19 1227  Vitals shown include unvalidated device data.  Last Pain:  Vitals:   08/09/19 0901  TempSrc: Oral  PainSc: 0-No pain         Complications: No apparent anesthesia complications

## 2019-08-09 NOTE — Progress Notes (Signed)
Assisted Dr. Carignan with left, ultrasound guided, popliteal block. Side rails up, monitors on throughout procedure. See vital signs in flow sheet. Tolerated Procedure well. 

## 2019-08-09 NOTE — Anesthesia Procedure Notes (Signed)
Procedure Name: Intubation Date/Time: 08/09/2019 10:52 AM Performed by: Clovis Cao, CRNA Pre-anesthesia Checklist: Patient identified, Emergency Drugs available, Suction available, Patient being monitored and Timeout performed Patient Re-evaluated:Patient Re-evaluated prior to induction Oxygen Delivery Method: Circle system utilized Preoxygenation: Pre-oxygenation with 100% oxygen Induction Type: IV induction Ventilation: Mask ventilation without difficulty Laryngoscope Size: Miller and 2 Grade View: Grade II Tube type: Oral Tube size: 8.0 mm Number of attempts: 2 Airway Equipment and Method: Stylet Placement Confirmation: ETT inserted through vocal cords under direct vision,  positive ETCO2 and breath sounds checked- equal and bilateral Secured at: 22 cm Tube secured with: Tape Dental Injury: Teeth and Oropharynx as per pre-operative assessment

## 2019-08-09 NOTE — Anesthesia Preprocedure Evaluation (Addendum)
Anesthesia Evaluation  Patient identified by MRN, date of birth, ID band Patient awake    Reviewed: Allergy & Precautions, NPO status , Patient's Chart, lab work & pertinent test results  Airway Mallampati: II  TM Distance: >3 FB Neck ROM: Full    Dental no notable dental hx.    Pulmonary asthma ,    Pulmonary exam normal breath sounds clear to auscultation       Cardiovascular negative cardio ROS Normal cardiovascular exam Rhythm:Regular Rate:Normal     Neuro/Psych negative neurological ROS  negative psych ROS   GI/Hepatic negative GI ROS, Neg liver ROS,   Endo/Other  negative endocrine ROS  Renal/GU negative Renal ROS  negative genitourinary   Musculoskeletal negative musculoskeletal ROS (+)   Abdominal   Peds negative pediatric ROS (+)  Hematology negative hematology ROS (+)   Anesthesia Other Findings   Reproductive/Obstetrics negative OB ROS                             Anesthesia Physical Anesthesia Plan  ASA: II  Anesthesia Plan: General   Post-op Pain Management:  Regional for Post-op pain   Induction: Intravenous  PONV Risk Score and Plan: 2 and Ondansetron, Dexamethasone and Treatment may vary due to age or medical condition  Airway Management Planned: LMA and Oral ETT  Additional Equipment:   Intra-op Plan:   Post-operative Plan: Extubation in OR  Informed Consent: I have reviewed the patients History and Physical, chart, labs and discussed the procedure including the risks, benefits and alternatives for the proposed anesthesia with the patient or authorized representative who has indicated his/her understanding and acceptance.     Dental advisory given  Plan Discussed with: CRNA  Anesthesia Plan Comments:         Anesthesia Quick Evaluation

## 2019-08-09 NOTE — H&P (Signed)
Chris Mosley is an 57 y.o. male.   Chief Complaint: Left insertional Achilles tendon rupture HPI: Patient is here today for surgical intervention for his left Achilles tendon rupture.  Prior to his injury 1 week ago he did have some discomfort in the posterior heel region.  Patient was seen 1 week ago by our nonoperative family medicine provider Dr. Rip Harbour who diagnosed him with an insertional Achilles tendon rupture.  He asked me to take over for definitive treatment.  Patient is here today for surgical correction.  He denies any recent fevers or chills.  He states his pain is been improving in the retro-and posterior heel region of his left leg.  He has some calf soreness but this is been improving.  He has been using a walking boot and maintaining nonweightbearing for the most part.  He denies any recent fevers or chills.  Patient takes an 81 mg aspirin daily.  No history of diabetes or smoking.  Past Medical History:  Diagnosis Date  . Asthma   . Gout   . History of kidney stones   . Pneumonia     Past Surgical History:  Procedure Laterality Date  . CYSTOSCOPY     kidney stone removal    History reviewed. No pertinent family history. Social History:  reports that he has never smoked. He has never used smokeless tobacco. He reports current alcohol use. He reports that he does not use drugs.  Allergies: No Known Allergies  Medications Prior to Admission  Medication Sig Dispense Refill  . allopurinol (ZYLOPRIM) 300 MG tablet Take 300 mg by mouth daily.    Marland Kitchen aspirin EC 81 MG tablet Take 81 mg by mouth daily.    . diclofenac (VOLTAREN) 75 MG EC tablet Take 75 mg by mouth 2 (two) times daily.    . montelukast (SINGULAIR) 10 MG tablet Take 10 mg by mouth at bedtime.    . simvastatin (ZOCOR) 20 MG tablet Take 20 mg by mouth daily.    Marland Kitchen zolpidem (AMBIEN) 10 MG tablet Take 10 mg by mouth at bedtime as needed for sleep.      No results found for this or any previous visit (from the  past 48 hour(s)). No results found.  Review of Systems  Constitutional: Negative.   HENT: Negative.   Eyes: Negative.   Respiratory: Negative.   Cardiovascular: Negative.   Gastrointestinal: Negative.   Musculoskeletal:       Left ankle pain  Skin: Negative.   Neurological: Negative.   Psychiatric/Behavioral: Negative.     Blood pressure 134/77, pulse 64, temperature 98.1 F (36.7 C), temperature source Oral, resp. rate 18, height 5\' 10"  (1.778 m), weight 100 kg, SpO2 100 %. Physical Exam  Constitutional: He appears well-developed.  HENT:  Head: Normocephalic.  Eyes: Conjunctivae are normal.  Neck: Neck supple.  Cardiovascular: Normal rate.  Respiratory: Effort normal.  GI: Soft.  Musculoskeletal:     Comments: Left ankle demonstrates pain to palpation and palpable defect to achilles.  Bony prominece present.  There is ecchymosis posteriorly about the ankle.  Patient endorses sensation to light touch grossly in the dorsal and plantar foot.  Is able to wiggle toes.  No tenderness palpation proximal about the calf or knee.  No other evidence of joint or extremity injury.  Neurological: He is alert.  Skin: Skin is warm.  Psychiatric: He has a normal mood and affect.     Assessment/Plan We will proceed with left Achilles reconstruction and debridement  with calcaneal exostectomy.  He understands the risk, benefits and alternatives of surgery which include but are not limited to wound healing complications, infection, continued pain, stiffness, need for further surgery and damage surrounding structures.  He also understands the perioperative and anesthetic risk which include death.  He understands the postoperative weightbearing restrictions and agrees to comply.  After weighing these risks he wishes to proceed with surgery.  Postoperatively he will begin taking a 325 mg aspirin daily for DVT prophylaxis.  He understands that he will be placed in a cast in a plantarflexed  position.  Terance Harthristopher R Adair, MD 08/09/2019, 9:58 AM

## 2019-08-09 NOTE — Anesthesia Procedure Notes (Addendum)
Anesthesia Regional Block: Popliteal block   Pre-Anesthetic Checklist: ,, timeout performed, Correct Patient, Correct Site, Correct Laterality, Correct Procedure, Correct Position, site marked, Risks and benefits discussed,  Surgical consent,  Pre-op evaluation,  At surgeon's request and post-op pain management  Laterality: Left and Lower  Prep: Maximum Sterile Barrier Precautions used, chloraprep       Needles:  Injection technique: Single-shot  Needle Type: Echogenic Stimulator Needle     Needle Length: 10cm      Additional Needles:   Procedures:,,,, ultrasound used (permanent image in chart),,,,  Narrative:  Start time: 08/09/2019 9:35 AM End time: 08/09/2019 9:45 AM Injection made incrementally with aspirations every 5 mL.  Performed by: Personally  Anesthesiologist: Montez Hageman, MD  Additional Notes: Risks, benefits and alternative to block explained extensively.  Patient tolerated procedure well, without complications.

## 2019-08-09 NOTE — Op Note (Signed)
Chris Mosley male 58 y.o. 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 male was playing tennis 1 week ago and felt a pop in the posterior aspect of his left ankle.  He had previously had some pain in this area.  He was seen in the office by our nonoperative sports medicine provider Dr. Rip Harbour and diagnosed with a distal Achilles rupture.  He was sent to me for definitive treatment.  Given his activity level and nature of his rupture he was indicated for Achilles debridement and reconstruction with calcaneal exostectomy.  Patient does not have a history of diabetes and is a non-smoker.  He takes an 81 mg aspirin daily.  We discussed the risk, benefits alternatives surgery which include but are not limited to wound healing complication, infection, stiffness, continued pain, rerupture, need for further surgery demonstrating structures.  We also discussed the perioperative and anesthetic risk which include death.  He understood the postoperative weightbearing restrictions and agreed to comply.  He opted proceed with surgery.  PROCEDURE:The patient was identified in the preoperative holding area.  The left leg was marked by myself.  The consent was signed by myself and the patient.  Peripheral nerve blockade was performed by anesthesia.  They was taken to the operative suite where general anesthesia was induced  without difficulty.  Preoperative antibiotics were given.  The patient was then placed prone on the operative table.  Chest rolls were used and all bony prominences were well-padded.  A thigh tourniquet was placed on the operative thigh.  The operative extremity was then prepped and draped in the usual sterile fashion.  Surgical timeout was performed.  We began by making a longitudinal incision on the medial aspect of the distal Achilles tendon that curved into the central portion of the calcaneus tuberosity.  This was taken sharply down through skin, subcutaneous tissue and peritenon.  Upon entering the peritenon there was a rush of hematoma and fluid.  There was a complete rupture of the tendon with bony fragmentation within the distal aspect of the rupture.  There is some residual tenderness fibers on the calcaneal tuberosity.  These were removed and debrided back to normal-appearing calcaneal tuberosity.  Then using a rondure this area was debrided as well.  The tendon was identified and medial and lateral sub-peritenon flaps were created proximally.    The tendon was diseased appearing with tendinosis portions as well as calcified portions distally. The diseased tendon was sharply debrided and excised with a 15 blade and scissors. Then using a Freer and a Cobb elevator the proximal portions of adhesed tendon were lysed circumferentially   There was a large amount of inflamed synovial tissue in the retro-calcaneal area.  The inflamed bursal and synovial tissue was excisionally debrided with 15 blade and excised.  Then the calcaneal tuberosity was inspected and using a sagittal saw the Haglund's bump and enthesophytes were removed.  Rondure was used to ensure adequate removal of all bony prominences medially, laterally and posteriorly.  Acceptable bony debridement was performed and the bone was excised.  Then using  the Arthrex drill the 4 drill tunnels were placed within the calcaneus and the holes were tapped.   Then the suture tape was run through the Achilles tendon within healthy-appearing tendon at the point of maximal tension with repair onto the calcaneus.  Then this was tied down to the calcaneus.  Then the suture bridge technique was used to fix this distally.  There was excellent fixation and tension on the Achilles after repair.  Then the medial, lateral and posterior aspect of the repair was inspected and palpated for any bony prominences and there were none found.  X-ray confirmed adequate bony resection.  The wound was then irrigated with normal saline and the tourniquet was released.  Hemostasis was obtained using Bovie cautery and direct compression.  Then the peritenon tissue was closed with a 2-0 PDS suture.  The subcuticular tissue was closed with 3-0 Monocryl and the skin with 3-0 nylon.   Xeroform, 4 x 4's and sterile she cotton was placed.  She was placed in a short leg nonweightbearing cast in a plantarflexed position.  There were no complications.  She tolerated the procedure well.  She was placed back in the supine position on her hospital bed and extubated.  She was taken to the recovery room in stable condition.  POST OPERATIVE INSTRUCTIONS: Nonweightbearing to left lower extremity Keep limb elevated Take a 325 mg aspirin for DVT prophylaxis Call the office with concerns Keep cast dry Follow-up in 2 weeks for cast removal, wound check and placement of a new cast.  TOURNIQUET TIME: 41 minutes  BLOOD LOSS:  Minimal         DRAINS: none         SPECIMEN: none       COMPLICATIONS:  * No complications entered in OR log *         Disposition: PACU - hemodynamically stable.         Condition: stable

## 2019-08-09 NOTE — Anesthesia Postprocedure Evaluation (Signed)
Anesthesia Post Note  Patient: Chris Mosley  Procedure(s) Performed: LEFT ACHILLES RECONSTRUCTION AND DEBRIDEMENT. CALCANEAL EXOSTECTOMY (Left Ankle)     Patient location during evaluation: PACU Anesthesia Type: General Level of consciousness: awake and alert Pain management: pain level controlled Vital Signs Assessment: post-procedure vital signs reviewed and stable Respiratory status: spontaneous breathing, nonlabored ventilation, respiratory function stable and patient connected to nasal cannula oxygen Cardiovascular status: blood pressure returned to baseline and stable Postop Assessment: no apparent nausea or vomiting Anesthetic complications: no    Last Vitals:  Vitals:   08/09/19 1230 08/09/19 1245  BP: 117/75 129/84  Pulse: 64 64  Resp: 15 16  Temp:    SpO2: 100% 99%    Last Pain:  Vitals:   08/09/19 1245  TempSrc:   PainSc: 0-No pain                 Montez Hageman

## 2019-08-09 NOTE — Discharge Instructions (Signed)
Post Anesthesia Home Care Instructions  Activity: Get plenty of rest for the remainder of the day. A responsible individual must stay with you for 24 hours following the procedure.  For the next 24 hours, DO NOT: -Drive a car -Paediatric nurse -Drink alcoholic beverages -Take any medication unless instructed by your physician -Make any legal decisions or sign important papers.  Meals: Start with liquid foods such as gelatin or soup. Progress to regular foods as tolerated. Avoid greasy, spicy, heavy foods. If nausea and/or vomiting occur, drink only clear liquids until the nausea and/or vomiting subsides. Call your physician if vomiting continues.  Special Instructions/Symptoms: Your throat may feel dry or sore from the anesthesia or the breathing tube placed in your throat during surgery. If this causes discomfort, gargle with warm salt water. The discomfort should disappear within 24 hours.  If you had a scopolamine patch placed behind your ear for the management of post- operative nausea and/or vomiting:  1. The medication in the patch is effective for 72 hours, after which it should be removed.  Wrap patch in a tissue and discard in the trash. Wash hands thoroughly with soap and water. 2. You may remove the patch earlier than 72 hours if you experience unpleasant side effects which may include dry mouth, dizziness or visual disturbances. 3. Avoid touching the patch. Wash your hands with soap and water after contact with the patch.    Regional Anesthesia Blocks  1. Numbness or the inability to move the "blocked" extremity may last from 3-48 hours after placement. The length of time depends on the medication injected and your individual response to the medication. If the numbness is not going away after 48 hours, call your surgeon.  2. The extremity that is blocked will need to be protected until the numbness is gone and the  Strength has returned. Because you cannot feel it, you will  need to take extra care to avoid injury. Because it may be weak, you may have difficulty moving it or using it. You may not know what position it is in without looking at it while the block is in effect.  3. For blocks in the legs and feet, returning to weight bearing and walking needs to be done carefully. You will need to wait until the numbness is entirely gone and the strength has returned. You should be able to move your leg and foot normally before you try and bear weight or walk. You will need someone to be with you when you first try to ensure you do not fall and possibly risk injury.  4. Bruising and tenderness at the needle site are common side effects and will resolve in a few days.  5. Persistent numbness or new problems with movement should be communicated to the surgeon or the [facility] [phone] [place] [phone]).DR. Lucia [facility]ask[facility] POST-OP INSTRUCTIONS   Pain Management 1. The numbing medicine and your leg will last around 8 hours, take a dose of your pain medicine as soon as you feel it wearing off to avoid rebound pain. 2. Keep your foot elevated above heart level.  Make sure that your heel hangs free ('floats'). 3. Take all prescribed medication as directed. 4. If taking narcotic pain medication you may want to use an over-the-counter stool softener to avoid constipation. 5. You may take over-the-counter NSAIDs (ibuprofen, naproxen, etc.) as well as over-the-counter acetaminophen as directed on the packaging as a supplement for your pain  and may also use it to wean away from the prescription medication. ° °Activity °?  °? Non-weightbearing °? Postoperatively, you will be placed into a splint which stays on for 2 weeks and then will be changed at your first postop visit. ° °First Postoperative Visit °1. Your first postop visit will be at least 2 weeks after surgery.  This should be scheduled when you schedule surgery. °2. If you do  not have a postoperative visit scheduled please call [phone] to schedule an appointment. °3. At the appointment your incision will be evaluated for suture removal, x-rays will be obtained if necessary. ° °General Instructions °1. Swelling is very common after foot and ankle surgery.  It often takes 3 months for the foot and ankle to begin to feel comfortable.  Some amount of swelling will persist for 6-12 months. °2. DO NOT change the dressing.  If there is a problem with the dressing (too tight, loose, gets wet, etc.) please contact Dr. Adair's office. °3. DO NOT get the dressing wet.  For showers you can use an over-the-counter cast cover or wrap a washcloth around the top of your dressing and then cover it with a plastic bag and tape it to your leg. °4. DO NOT soak the incision (no tubs, pools, bath, etc.) until you have approval from Dr. Adair. ° °Contact Dr. Adairs office or go to Emergency Room if: °1. Temperature above 101° F. °2. Increasing pain that is unresponsive to pain medication or elevation °3. Excessive redness or swelling in your foot °4. Dressing problems - excessive bloody drainage, looseness or tightness, or if dressing gets wet °5. Develop pain, swelling, warmth, or discoloration of your calf ° °

## 2019-08-10 ENCOUNTER — Encounter (HOSPITAL_BASED_OUTPATIENT_CLINIC_OR_DEPARTMENT_OTHER): Payer: Self-pay | Admitting: Orthopaedic Surgery

## 2019-08-23 ENCOUNTER — Other Ambulatory Visit: Payer: Self-pay

## 2019-08-23 DIAGNOSIS — R6889 Other general symptoms and signs: Secondary | ICD-10-CM | POA: Diagnosis not present

## 2019-08-23 DIAGNOSIS — Z20822 Contact with and (suspected) exposure to covid-19: Secondary | ICD-10-CM

## 2019-08-23 DIAGNOSIS — Z20828 Contact with and (suspected) exposure to other viral communicable diseases: Secondary | ICD-10-CM | POA: Diagnosis not present

## 2019-08-25 DIAGNOSIS — Z20828 Contact with and (suspected) exposure to other viral communicable diseases: Secondary | ICD-10-CM | POA: Diagnosis not present

## 2019-08-25 LAB — NOVEL CORONAVIRUS, NAA: SARS-CoV-2, NAA: NOT DETECTED

## 2019-08-26 DIAGNOSIS — Z20828 Contact with and (suspected) exposure to other viral communicable diseases: Secondary | ICD-10-CM | POA: Diagnosis not present

## 2019-09-12 DIAGNOSIS — M66862 Spontaneous rupture of other tendons, left lower leg: Secondary | ICD-10-CM | POA: Diagnosis not present

## 2019-09-22 ENCOUNTER — Other Ambulatory Visit: Payer: Self-pay

## 2019-09-22 DIAGNOSIS — Z20828 Contact with and (suspected) exposure to other viral communicable diseases: Secondary | ICD-10-CM | POA: Diagnosis not present

## 2019-09-22 DIAGNOSIS — Z20822 Contact with and (suspected) exposure to covid-19: Secondary | ICD-10-CM

## 2019-09-23 LAB — NOVEL CORONAVIRUS, NAA: SARS-CoV-2, NAA: NOT DETECTED

## 2019-10-24 DIAGNOSIS — M66862 Spontaneous rupture of other tendons, left lower leg: Secondary | ICD-10-CM | POA: Diagnosis not present

## 2019-10-28 DIAGNOSIS — G4700 Insomnia, unspecified: Secondary | ICD-10-CM | POA: Diagnosis not present

## 2019-10-28 DIAGNOSIS — J3089 Other allergic rhinitis: Secondary | ICD-10-CM | POA: Diagnosis not present

## 2019-10-28 DIAGNOSIS — J301 Allergic rhinitis due to pollen: Secondary | ICD-10-CM | POA: Diagnosis not present

## 2019-10-28 DIAGNOSIS — I10 Essential (primary) hypertension: Secondary | ICD-10-CM | POA: Diagnosis not present

## 2019-11-04 DIAGNOSIS — J3089 Other allergic rhinitis: Secondary | ICD-10-CM | POA: Diagnosis not present

## 2019-11-04 DIAGNOSIS — J301 Allergic rhinitis due to pollen: Secondary | ICD-10-CM | POA: Diagnosis not present

## 2019-11-08 DIAGNOSIS — J301 Allergic rhinitis due to pollen: Secondary | ICD-10-CM | POA: Diagnosis not present

## 2019-11-08 DIAGNOSIS — J3089 Other allergic rhinitis: Secondary | ICD-10-CM | POA: Diagnosis not present

## 2019-11-18 DIAGNOSIS — J301 Allergic rhinitis due to pollen: Secondary | ICD-10-CM | POA: Diagnosis not present

## 2019-11-18 DIAGNOSIS — J3089 Other allergic rhinitis: Secondary | ICD-10-CM | POA: Diagnosis not present

## 2019-11-21 DIAGNOSIS — J3089 Other allergic rhinitis: Secondary | ICD-10-CM | POA: Diagnosis not present

## 2019-11-21 DIAGNOSIS — J301 Allergic rhinitis due to pollen: Secondary | ICD-10-CM | POA: Diagnosis not present

## 2019-11-25 DIAGNOSIS — J3089 Other allergic rhinitis: Secondary | ICD-10-CM | POA: Diagnosis not present

## 2019-11-25 DIAGNOSIS — J301 Allergic rhinitis due to pollen: Secondary | ICD-10-CM | POA: Diagnosis not present

## 2019-12-06 DIAGNOSIS — J3089 Other allergic rhinitis: Secondary | ICD-10-CM | POA: Diagnosis not present

## 2019-12-06 DIAGNOSIS — J301 Allergic rhinitis due to pollen: Secondary | ICD-10-CM | POA: Diagnosis not present

## 2019-12-20 DIAGNOSIS — J3089 Other allergic rhinitis: Secondary | ICD-10-CM | POA: Diagnosis not present

## 2019-12-20 DIAGNOSIS — J301 Allergic rhinitis due to pollen: Secondary | ICD-10-CM | POA: Diagnosis not present

## 2020-01-03 DIAGNOSIS — J301 Allergic rhinitis due to pollen: Secondary | ICD-10-CM | POA: Diagnosis not present

## 2020-01-03 DIAGNOSIS — J3089 Other allergic rhinitis: Secondary | ICD-10-CM | POA: Diagnosis not present

## 2020-01-20 DIAGNOSIS — J301 Allergic rhinitis due to pollen: Secondary | ICD-10-CM | POA: Diagnosis not present

## 2020-01-20 DIAGNOSIS — J3089 Other allergic rhinitis: Secondary | ICD-10-CM | POA: Diagnosis not present

## 2020-01-23 DIAGNOSIS — J4540 Moderate persistent asthma, uncomplicated: Secondary | ICD-10-CM | POA: Diagnosis not present

## 2020-01-23 DIAGNOSIS — M66862 Spontaneous rupture of other tendons, left lower leg: Secondary | ICD-10-CM | POA: Diagnosis not present

## 2020-01-23 DIAGNOSIS — J301 Allergic rhinitis due to pollen: Secondary | ICD-10-CM | POA: Diagnosis not present

## 2020-01-23 DIAGNOSIS — J3089 Other allergic rhinitis: Secondary | ICD-10-CM | POA: Diagnosis not present

## 2020-02-03 DIAGNOSIS — J3089 Other allergic rhinitis: Secondary | ICD-10-CM | POA: Diagnosis not present

## 2020-02-03 DIAGNOSIS — J301 Allergic rhinitis due to pollen: Secondary | ICD-10-CM | POA: Diagnosis not present

## 2020-02-14 DIAGNOSIS — J3089 Other allergic rhinitis: Secondary | ICD-10-CM | POA: Diagnosis not present

## 2020-02-14 DIAGNOSIS — J301 Allergic rhinitis due to pollen: Secondary | ICD-10-CM | POA: Diagnosis not present

## 2020-03-01 DIAGNOSIS — J301 Allergic rhinitis due to pollen: Secondary | ICD-10-CM | POA: Diagnosis not present

## 2020-03-01 DIAGNOSIS — J3089 Other allergic rhinitis: Secondary | ICD-10-CM | POA: Diagnosis not present

## 2020-03-08 DIAGNOSIS — Z5181 Encounter for therapeutic drug level monitoring: Secondary | ICD-10-CM | POA: Diagnosis not present

## 2020-03-08 DIAGNOSIS — Z125 Encounter for screening for malignant neoplasm of prostate: Secondary | ICD-10-CM | POA: Diagnosis not present

## 2020-03-08 DIAGNOSIS — M109 Gout, unspecified: Secondary | ICD-10-CM | POA: Diagnosis not present

## 2020-03-08 DIAGNOSIS — Z0000 Encounter for general adult medical examination without abnormal findings: Secondary | ICD-10-CM | POA: Diagnosis not present

## 2020-03-08 DIAGNOSIS — Z79899 Other long term (current) drug therapy: Secondary | ICD-10-CM | POA: Diagnosis not present

## 2020-03-08 DIAGNOSIS — E7800 Pure hypercholesterolemia, unspecified: Secondary | ICD-10-CM | POA: Diagnosis not present

## 2020-03-19 DIAGNOSIS — J301 Allergic rhinitis due to pollen: Secondary | ICD-10-CM | POA: Diagnosis not present

## 2020-03-19 DIAGNOSIS — J3089 Other allergic rhinitis: Secondary | ICD-10-CM | POA: Diagnosis not present

## 2020-03-30 DIAGNOSIS — J3089 Other allergic rhinitis: Secondary | ICD-10-CM | POA: Diagnosis not present

## 2020-03-30 DIAGNOSIS — J301 Allergic rhinitis due to pollen: Secondary | ICD-10-CM | POA: Diagnosis not present

## 2020-04-03 DIAGNOSIS — J3089 Other allergic rhinitis: Secondary | ICD-10-CM | POA: Diagnosis not present

## 2020-04-03 DIAGNOSIS — J301 Allergic rhinitis due to pollen: Secondary | ICD-10-CM | POA: Diagnosis not present

## 2020-04-18 DIAGNOSIS — J301 Allergic rhinitis due to pollen: Secondary | ICD-10-CM | POA: Diagnosis not present

## 2020-04-18 DIAGNOSIS — J3089 Other allergic rhinitis: Secondary | ICD-10-CM | POA: Diagnosis not present

## 2020-04-25 DIAGNOSIS — J301 Allergic rhinitis due to pollen: Secondary | ICD-10-CM | POA: Diagnosis not present

## 2020-04-25 DIAGNOSIS — J3089 Other allergic rhinitis: Secondary | ICD-10-CM | POA: Diagnosis not present

## 2020-05-09 DIAGNOSIS — J301 Allergic rhinitis due to pollen: Secondary | ICD-10-CM | POA: Diagnosis not present

## 2020-05-09 DIAGNOSIS — J3089 Other allergic rhinitis: Secondary | ICD-10-CM | POA: Diagnosis not present

## 2020-05-11 DIAGNOSIS — J301 Allergic rhinitis due to pollen: Secondary | ICD-10-CM | POA: Diagnosis not present

## 2020-05-11 DIAGNOSIS — J3089 Other allergic rhinitis: Secondary | ICD-10-CM | POA: Diagnosis not present

## 2020-05-22 DIAGNOSIS — J301 Allergic rhinitis due to pollen: Secondary | ICD-10-CM | POA: Diagnosis not present

## 2020-05-22 DIAGNOSIS — J3089 Other allergic rhinitis: Secondary | ICD-10-CM | POA: Diagnosis not present

## 2020-05-31 DIAGNOSIS — J301 Allergic rhinitis due to pollen: Secondary | ICD-10-CM | POA: Diagnosis not present

## 2020-05-31 DIAGNOSIS — J3089 Other allergic rhinitis: Secondary | ICD-10-CM | POA: Diagnosis not present

## 2020-06-06 DIAGNOSIS — J3089 Other allergic rhinitis: Secondary | ICD-10-CM | POA: Diagnosis not present

## 2020-06-06 DIAGNOSIS — J301 Allergic rhinitis due to pollen: Secondary | ICD-10-CM | POA: Diagnosis not present

## 2020-06-19 DIAGNOSIS — J3089 Other allergic rhinitis: Secondary | ICD-10-CM | POA: Diagnosis not present

## 2020-06-19 DIAGNOSIS — J301 Allergic rhinitis due to pollen: Secondary | ICD-10-CM | POA: Diagnosis not present

## 2020-07-06 DIAGNOSIS — J3089 Other allergic rhinitis: Secondary | ICD-10-CM | POA: Diagnosis not present

## 2020-07-06 DIAGNOSIS — J301 Allergic rhinitis due to pollen: Secondary | ICD-10-CM | POA: Diagnosis not present

## 2020-07-23 DIAGNOSIS — J301 Allergic rhinitis due to pollen: Secondary | ICD-10-CM | POA: Diagnosis not present

## 2020-07-23 DIAGNOSIS — J3089 Other allergic rhinitis: Secondary | ICD-10-CM | POA: Diagnosis not present

## 2020-08-07 DIAGNOSIS — J3089 Other allergic rhinitis: Secondary | ICD-10-CM | POA: Diagnosis not present

## 2020-08-07 DIAGNOSIS — J301 Allergic rhinitis due to pollen: Secondary | ICD-10-CM | POA: Diagnosis not present

## 2020-08-30 DIAGNOSIS — J3089 Other allergic rhinitis: Secondary | ICD-10-CM | POA: Diagnosis not present

## 2020-08-30 DIAGNOSIS — J301 Allergic rhinitis due to pollen: Secondary | ICD-10-CM | POA: Diagnosis not present

## 2020-09-11 DIAGNOSIS — J301 Allergic rhinitis due to pollen: Secondary | ICD-10-CM | POA: Diagnosis not present

## 2020-09-11 DIAGNOSIS — J3089 Other allergic rhinitis: Secondary | ICD-10-CM | POA: Diagnosis not present

## 2020-09-17 DIAGNOSIS — R0981 Nasal congestion: Secondary | ICD-10-CM | POA: Diagnosis not present

## 2020-09-17 DIAGNOSIS — G4700 Insomnia, unspecified: Secondary | ICD-10-CM | POA: Diagnosis not present

## 2020-09-27 DIAGNOSIS — J301 Allergic rhinitis due to pollen: Secondary | ICD-10-CM | POA: Diagnosis not present

## 2020-09-27 DIAGNOSIS — J3089 Other allergic rhinitis: Secondary | ICD-10-CM | POA: Diagnosis not present

## 2020-10-08 DIAGNOSIS — J301 Allergic rhinitis due to pollen: Secondary | ICD-10-CM | POA: Diagnosis not present

## 2020-10-08 DIAGNOSIS — J3089 Other allergic rhinitis: Secondary | ICD-10-CM | POA: Diagnosis not present

## 2020-10-22 DIAGNOSIS — J301 Allergic rhinitis due to pollen: Secondary | ICD-10-CM | POA: Diagnosis not present

## 2020-10-22 DIAGNOSIS — J3089 Other allergic rhinitis: Secondary | ICD-10-CM | POA: Diagnosis not present

## 2020-11-05 DIAGNOSIS — J3089 Other allergic rhinitis: Secondary | ICD-10-CM | POA: Diagnosis not present

## 2020-11-05 DIAGNOSIS — J301 Allergic rhinitis due to pollen: Secondary | ICD-10-CM | POA: Diagnosis not present

## 2020-11-30 DIAGNOSIS — J301 Allergic rhinitis due to pollen: Secondary | ICD-10-CM | POA: Diagnosis not present

## 2020-11-30 DIAGNOSIS — J3089 Other allergic rhinitis: Secondary | ICD-10-CM | POA: Diagnosis not present

## 2020-12-10 DIAGNOSIS — J3089 Other allergic rhinitis: Secondary | ICD-10-CM | POA: Diagnosis not present

## 2020-12-10 DIAGNOSIS — J301 Allergic rhinitis due to pollen: Secondary | ICD-10-CM | POA: Diagnosis not present

## 2020-12-28 DIAGNOSIS — J301 Allergic rhinitis due to pollen: Secondary | ICD-10-CM | POA: Diagnosis not present

## 2020-12-28 DIAGNOSIS — J3089 Other allergic rhinitis: Secondary | ICD-10-CM | POA: Diagnosis not present

## 2021-01-16 DIAGNOSIS — J3089 Other allergic rhinitis: Secondary | ICD-10-CM | POA: Diagnosis not present

## 2021-01-16 DIAGNOSIS — J301 Allergic rhinitis due to pollen: Secondary | ICD-10-CM | POA: Diagnosis not present

## 2021-01-22 DIAGNOSIS — J301 Allergic rhinitis due to pollen: Secondary | ICD-10-CM | POA: Diagnosis not present

## 2021-01-22 DIAGNOSIS — J4540 Moderate persistent asthma, uncomplicated: Secondary | ICD-10-CM | POA: Diagnosis not present

## 2021-01-22 DIAGNOSIS — J310 Chronic rhinitis: Secondary | ICD-10-CM | POA: Diagnosis not present

## 2021-01-22 DIAGNOSIS — J3089 Other allergic rhinitis: Secondary | ICD-10-CM | POA: Diagnosis not present

## 2021-01-29 DIAGNOSIS — J301 Allergic rhinitis due to pollen: Secondary | ICD-10-CM | POA: Diagnosis not present

## 2021-01-29 DIAGNOSIS — J3089 Other allergic rhinitis: Secondary | ICD-10-CM | POA: Diagnosis not present

## 2021-02-11 DIAGNOSIS — J3089 Other allergic rhinitis: Secondary | ICD-10-CM | POA: Diagnosis not present

## 2021-02-11 DIAGNOSIS — J301 Allergic rhinitis due to pollen: Secondary | ICD-10-CM | POA: Diagnosis not present

## 2021-02-26 DIAGNOSIS — J301 Allergic rhinitis due to pollen: Secondary | ICD-10-CM | POA: Diagnosis not present

## 2021-02-26 DIAGNOSIS — J3089 Other allergic rhinitis: Secondary | ICD-10-CM | POA: Diagnosis not present

## 2021-03-15 DIAGNOSIS — J301 Allergic rhinitis due to pollen: Secondary | ICD-10-CM | POA: Diagnosis not present

## 2021-03-15 DIAGNOSIS — J3089 Other allergic rhinitis: Secondary | ICD-10-CM | POA: Diagnosis not present

## 2021-03-25 DIAGNOSIS — J301 Allergic rhinitis due to pollen: Secondary | ICD-10-CM | POA: Diagnosis not present

## 2021-03-25 DIAGNOSIS — J3089 Other allergic rhinitis: Secondary | ICD-10-CM | POA: Diagnosis not present

## 2021-04-15 DIAGNOSIS — E785 Hyperlipidemia, unspecified: Secondary | ICD-10-CM | POA: Diagnosis not present

## 2021-04-15 DIAGNOSIS — G4700 Insomnia, unspecified: Secondary | ICD-10-CM | POA: Diagnosis not present

## 2021-04-15 DIAGNOSIS — M109 Gout, unspecified: Secondary | ICD-10-CM | POA: Diagnosis not present

## 2021-04-15 DIAGNOSIS — Z79899 Other long term (current) drug therapy: Secondary | ICD-10-CM | POA: Diagnosis not present

## 2021-04-16 DIAGNOSIS — J301 Allergic rhinitis due to pollen: Secondary | ICD-10-CM | POA: Diagnosis not present

## 2021-04-16 DIAGNOSIS — J3089 Other allergic rhinitis: Secondary | ICD-10-CM | POA: Diagnosis not present

## 2021-04-17 DIAGNOSIS — E785 Hyperlipidemia, unspecified: Secondary | ICD-10-CM | POA: Diagnosis not present

## 2021-04-17 DIAGNOSIS — Z125 Encounter for screening for malignant neoplasm of prostate: Secondary | ICD-10-CM | POA: Diagnosis not present

## 2021-04-17 DIAGNOSIS — Z79899 Other long term (current) drug therapy: Secondary | ICD-10-CM | POA: Diagnosis not present

## 2021-04-17 DIAGNOSIS — R7301 Impaired fasting glucose: Secondary | ICD-10-CM | POA: Diagnosis not present

## 2021-04-17 DIAGNOSIS — M109 Gout, unspecified: Secondary | ICD-10-CM | POA: Diagnosis not present

## 2021-05-09 DIAGNOSIS — J301 Allergic rhinitis due to pollen: Secondary | ICD-10-CM | POA: Diagnosis not present

## 2021-05-09 DIAGNOSIS — J3089 Other allergic rhinitis: Secondary | ICD-10-CM | POA: Diagnosis not present

## 2021-05-27 DIAGNOSIS — J301 Allergic rhinitis due to pollen: Secondary | ICD-10-CM | POA: Diagnosis not present

## 2021-05-27 DIAGNOSIS — J3089 Other allergic rhinitis: Secondary | ICD-10-CM | POA: Diagnosis not present

## 2021-06-14 DIAGNOSIS — J301 Allergic rhinitis due to pollen: Secondary | ICD-10-CM | POA: Diagnosis not present

## 2021-06-14 DIAGNOSIS — J3089 Other allergic rhinitis: Secondary | ICD-10-CM | POA: Diagnosis not present

## 2021-07-03 DIAGNOSIS — J3089 Other allergic rhinitis: Secondary | ICD-10-CM | POA: Diagnosis not present

## 2021-07-03 DIAGNOSIS — J301 Allergic rhinitis due to pollen: Secondary | ICD-10-CM | POA: Diagnosis not present

## 2021-07-05 DIAGNOSIS — J3089 Other allergic rhinitis: Secondary | ICD-10-CM | POA: Diagnosis not present

## 2021-07-05 DIAGNOSIS — J301 Allergic rhinitis due to pollen: Secondary | ICD-10-CM | POA: Diagnosis not present

## 2021-07-10 DIAGNOSIS — J301 Allergic rhinitis due to pollen: Secondary | ICD-10-CM | POA: Diagnosis not present

## 2021-07-10 DIAGNOSIS — J3089 Other allergic rhinitis: Secondary | ICD-10-CM | POA: Diagnosis not present

## 2021-07-23 DIAGNOSIS — J3089 Other allergic rhinitis: Secondary | ICD-10-CM | POA: Diagnosis not present

## 2021-07-23 DIAGNOSIS — J301 Allergic rhinitis due to pollen: Secondary | ICD-10-CM | POA: Diagnosis not present

## 2021-08-05 DIAGNOSIS — J301 Allergic rhinitis due to pollen: Secondary | ICD-10-CM | POA: Diagnosis not present

## 2021-08-05 DIAGNOSIS — J3089 Other allergic rhinitis: Secondary | ICD-10-CM | POA: Diagnosis not present

## 2021-08-23 DIAGNOSIS — J301 Allergic rhinitis due to pollen: Secondary | ICD-10-CM | POA: Diagnosis not present

## 2021-08-23 DIAGNOSIS — J3089 Other allergic rhinitis: Secondary | ICD-10-CM | POA: Diagnosis not present

## 2021-09-05 DIAGNOSIS — J3089 Other allergic rhinitis: Secondary | ICD-10-CM | POA: Diagnosis not present

## 2021-09-05 DIAGNOSIS — J301 Allergic rhinitis due to pollen: Secondary | ICD-10-CM | POA: Diagnosis not present

## 2021-09-23 DIAGNOSIS — J3089 Other allergic rhinitis: Secondary | ICD-10-CM | POA: Diagnosis not present

## 2021-09-23 DIAGNOSIS — J301 Allergic rhinitis due to pollen: Secondary | ICD-10-CM | POA: Diagnosis not present

## 2021-10-14 DIAGNOSIS — J301 Allergic rhinitis due to pollen: Secondary | ICD-10-CM | POA: Diagnosis not present

## 2021-10-14 DIAGNOSIS — J3089 Other allergic rhinitis: Secondary | ICD-10-CM | POA: Diagnosis not present

## 2021-11-01 DIAGNOSIS — J3089 Other allergic rhinitis: Secondary | ICD-10-CM | POA: Diagnosis not present

## 2021-11-01 DIAGNOSIS — J301 Allergic rhinitis due to pollen: Secondary | ICD-10-CM | POA: Diagnosis not present

## 2021-11-15 DIAGNOSIS — J3089 Other allergic rhinitis: Secondary | ICD-10-CM | POA: Diagnosis not present

## 2021-11-15 DIAGNOSIS — J301 Allergic rhinitis due to pollen: Secondary | ICD-10-CM | POA: Diagnosis not present

## 2021-12-03 DIAGNOSIS — J3089 Other allergic rhinitis: Secondary | ICD-10-CM | POA: Diagnosis not present

## 2021-12-03 DIAGNOSIS — J301 Allergic rhinitis due to pollen: Secondary | ICD-10-CM | POA: Diagnosis not present

## 2021-12-17 DIAGNOSIS — J3089 Other allergic rhinitis: Secondary | ICD-10-CM | POA: Diagnosis not present

## 2021-12-17 DIAGNOSIS — J301 Allergic rhinitis due to pollen: Secondary | ICD-10-CM | POA: Diagnosis not present

## 2021-12-31 DIAGNOSIS — J301 Allergic rhinitis due to pollen: Secondary | ICD-10-CM | POA: Diagnosis not present

## 2021-12-31 DIAGNOSIS — J3089 Other allergic rhinitis: Secondary | ICD-10-CM | POA: Diagnosis not present

## 2022-01-20 DIAGNOSIS — J3089 Other allergic rhinitis: Secondary | ICD-10-CM | POA: Diagnosis not present

## 2022-01-20 DIAGNOSIS — M109 Gout, unspecified: Secondary | ICD-10-CM | POA: Diagnosis not present

## 2022-01-20 DIAGNOSIS — R7301 Impaired fasting glucose: Secondary | ICD-10-CM | POA: Diagnosis not present

## 2022-01-20 DIAGNOSIS — J069 Acute upper respiratory infection, unspecified: Secondary | ICD-10-CM | POA: Diagnosis not present

## 2022-01-20 DIAGNOSIS — Z79899 Other long term (current) drug therapy: Secondary | ICD-10-CM | POA: Diagnosis not present

## 2022-01-20 DIAGNOSIS — Z125 Encounter for screening for malignant neoplasm of prostate: Secondary | ICD-10-CM | POA: Diagnosis not present

## 2022-01-20 DIAGNOSIS — J310 Chronic rhinitis: Secondary | ICD-10-CM | POA: Diagnosis not present

## 2022-01-20 DIAGNOSIS — J4540 Moderate persistent asthma, uncomplicated: Secondary | ICD-10-CM | POA: Diagnosis not present

## 2022-01-20 DIAGNOSIS — E785 Hyperlipidemia, unspecified: Secondary | ICD-10-CM | POA: Diagnosis not present

## 2022-01-20 DIAGNOSIS — J301 Allergic rhinitis due to pollen: Secondary | ICD-10-CM | POA: Diagnosis not present

## 2022-01-21 DIAGNOSIS — J301 Allergic rhinitis due to pollen: Secondary | ICD-10-CM | POA: Diagnosis not present

## 2022-01-21 DIAGNOSIS — J3089 Other allergic rhinitis: Secondary | ICD-10-CM | POA: Diagnosis not present

## 2022-01-21 DIAGNOSIS — Z0000 Encounter for general adult medical examination without abnormal findings: Secondary | ICD-10-CM | POA: Diagnosis not present

## 2022-02-03 DIAGNOSIS — J301 Allergic rhinitis due to pollen: Secondary | ICD-10-CM | POA: Diagnosis not present

## 2022-02-03 DIAGNOSIS — J3089 Other allergic rhinitis: Secondary | ICD-10-CM | POA: Diagnosis not present

## 2022-02-17 DIAGNOSIS — J3089 Other allergic rhinitis: Secondary | ICD-10-CM | POA: Diagnosis not present

## 2022-02-17 DIAGNOSIS — J301 Allergic rhinitis due to pollen: Secondary | ICD-10-CM | POA: Diagnosis not present

## 2022-02-19 DIAGNOSIS — J3089 Other allergic rhinitis: Secondary | ICD-10-CM | POA: Diagnosis not present

## 2022-02-19 DIAGNOSIS — J301 Allergic rhinitis due to pollen: Secondary | ICD-10-CM | POA: Diagnosis not present

## 2022-03-13 DIAGNOSIS — J3081 Allergic rhinitis due to animal (cat) (dog) hair and dander: Secondary | ICD-10-CM | POA: Diagnosis not present

## 2022-03-13 DIAGNOSIS — J301 Allergic rhinitis due to pollen: Secondary | ICD-10-CM | POA: Diagnosis not present

## 2022-03-13 DIAGNOSIS — J3089 Other allergic rhinitis: Secondary | ICD-10-CM | POA: Diagnosis not present

## 2022-04-04 DIAGNOSIS — J301 Allergic rhinitis due to pollen: Secondary | ICD-10-CM | POA: Diagnosis not present

## 2022-04-04 DIAGNOSIS — J3089 Other allergic rhinitis: Secondary | ICD-10-CM | POA: Diagnosis not present

## 2022-04-04 DIAGNOSIS — J3081 Allergic rhinitis due to animal (cat) (dog) hair and dander: Secondary | ICD-10-CM | POA: Diagnosis not present

## 2022-04-11 DIAGNOSIS — J3081 Allergic rhinitis due to animal (cat) (dog) hair and dander: Secondary | ICD-10-CM | POA: Diagnosis not present

## 2022-04-11 DIAGNOSIS — J3089 Other allergic rhinitis: Secondary | ICD-10-CM | POA: Diagnosis not present

## 2022-04-11 DIAGNOSIS — J301 Allergic rhinitis due to pollen: Secondary | ICD-10-CM | POA: Diagnosis not present

## 2022-04-23 DIAGNOSIS — J3089 Other allergic rhinitis: Secondary | ICD-10-CM | POA: Diagnosis not present

## 2022-04-23 DIAGNOSIS — J301 Allergic rhinitis due to pollen: Secondary | ICD-10-CM | POA: Diagnosis not present

## 2022-04-29 DIAGNOSIS — H903 Sensorineural hearing loss, bilateral: Secondary | ICD-10-CM | POA: Diagnosis not present

## 2022-04-29 DIAGNOSIS — J301 Allergic rhinitis due to pollen: Secondary | ICD-10-CM | POA: Diagnosis not present

## 2022-04-29 DIAGNOSIS — J310 Chronic rhinitis: Secondary | ICD-10-CM | POA: Diagnosis not present

## 2022-04-29 DIAGNOSIS — J343 Hypertrophy of nasal turbinates: Secondary | ICD-10-CM | POA: Diagnosis not present

## 2022-04-29 DIAGNOSIS — J3089 Other allergic rhinitis: Secondary | ICD-10-CM | POA: Diagnosis not present

## 2022-04-29 DIAGNOSIS — J342 Deviated nasal septum: Secondary | ICD-10-CM | POA: Diagnosis not present

## 2022-04-29 DIAGNOSIS — R0982 Postnasal drip: Secondary | ICD-10-CM | POA: Diagnosis not present

## 2022-05-13 DIAGNOSIS — J3089 Other allergic rhinitis: Secondary | ICD-10-CM | POA: Diagnosis not present

## 2022-05-13 DIAGNOSIS — J301 Allergic rhinitis due to pollen: Secondary | ICD-10-CM | POA: Diagnosis not present

## 2022-05-13 DIAGNOSIS — J3081 Allergic rhinitis due to animal (cat) (dog) hair and dander: Secondary | ICD-10-CM | POA: Diagnosis not present

## 2022-05-16 DIAGNOSIS — J301 Allergic rhinitis due to pollen: Secondary | ICD-10-CM | POA: Diagnosis not present

## 2022-05-16 DIAGNOSIS — J3089 Other allergic rhinitis: Secondary | ICD-10-CM | POA: Diagnosis not present

## 2022-05-26 DIAGNOSIS — J301 Allergic rhinitis due to pollen: Secondary | ICD-10-CM | POA: Diagnosis not present

## 2022-05-26 DIAGNOSIS — J3089 Other allergic rhinitis: Secondary | ICD-10-CM | POA: Diagnosis not present

## 2022-05-26 DIAGNOSIS — J3081 Allergic rhinitis due to animal (cat) (dog) hair and dander: Secondary | ICD-10-CM | POA: Diagnosis not present

## 2022-05-29 DIAGNOSIS — J301 Allergic rhinitis due to pollen: Secondary | ICD-10-CM | POA: Diagnosis not present

## 2022-05-29 DIAGNOSIS — J3089 Other allergic rhinitis: Secondary | ICD-10-CM | POA: Diagnosis not present

## 2022-05-29 DIAGNOSIS — J3081 Allergic rhinitis due to animal (cat) (dog) hair and dander: Secondary | ICD-10-CM | POA: Diagnosis not present

## 2022-06-02 DIAGNOSIS — J3089 Other allergic rhinitis: Secondary | ICD-10-CM | POA: Diagnosis not present

## 2022-06-02 DIAGNOSIS — J3081 Allergic rhinitis due to animal (cat) (dog) hair and dander: Secondary | ICD-10-CM | POA: Diagnosis not present

## 2022-06-02 DIAGNOSIS — J301 Allergic rhinitis due to pollen: Secondary | ICD-10-CM | POA: Diagnosis not present

## 2022-06-13 DIAGNOSIS — J301 Allergic rhinitis due to pollen: Secondary | ICD-10-CM | POA: Diagnosis not present

## 2022-06-13 DIAGNOSIS — J3089 Other allergic rhinitis: Secondary | ICD-10-CM | POA: Diagnosis not present

## 2022-06-26 DIAGNOSIS — J3081 Allergic rhinitis due to animal (cat) (dog) hair and dander: Secondary | ICD-10-CM | POA: Diagnosis not present

## 2022-06-26 DIAGNOSIS — J3089 Other allergic rhinitis: Secondary | ICD-10-CM | POA: Diagnosis not present

## 2022-06-26 DIAGNOSIS — J301 Allergic rhinitis due to pollen: Secondary | ICD-10-CM | POA: Diagnosis not present

## 2022-07-11 DIAGNOSIS — J301 Allergic rhinitis due to pollen: Secondary | ICD-10-CM | POA: Diagnosis not present

## 2022-07-11 DIAGNOSIS — J3081 Allergic rhinitis due to animal (cat) (dog) hair and dander: Secondary | ICD-10-CM | POA: Diagnosis not present

## 2022-07-11 DIAGNOSIS — J3089 Other allergic rhinitis: Secondary | ICD-10-CM | POA: Diagnosis not present

## 2022-07-22 DIAGNOSIS — J343 Hypertrophy of nasal turbinates: Secondary | ICD-10-CM | POA: Diagnosis not present

## 2022-07-22 DIAGNOSIS — J342 Deviated nasal septum: Secondary | ICD-10-CM | POA: Diagnosis not present

## 2022-07-22 DIAGNOSIS — J3489 Other specified disorders of nose and nasal sinuses: Secondary | ICD-10-CM | POA: Diagnosis not present

## 2022-07-29 DIAGNOSIS — J301 Allergic rhinitis due to pollen: Secondary | ICD-10-CM | POA: Diagnosis not present

## 2022-07-29 DIAGNOSIS — J3081 Allergic rhinitis due to animal (cat) (dog) hair and dander: Secondary | ICD-10-CM | POA: Diagnosis not present

## 2022-07-29 DIAGNOSIS — J3089 Other allergic rhinitis: Secondary | ICD-10-CM | POA: Diagnosis not present

## 2022-08-14 DIAGNOSIS — E785 Hyperlipidemia, unspecified: Secondary | ICD-10-CM | POA: Diagnosis not present

## 2022-08-14 DIAGNOSIS — J301 Allergic rhinitis due to pollen: Secondary | ICD-10-CM | POA: Diagnosis not present

## 2022-08-14 DIAGNOSIS — J3089 Other allergic rhinitis: Secondary | ICD-10-CM | POA: Diagnosis not present

## 2022-08-14 DIAGNOSIS — I10 Essential (primary) hypertension: Secondary | ICD-10-CM | POA: Diagnosis not present

## 2022-08-14 DIAGNOSIS — G4700 Insomnia, unspecified: Secondary | ICD-10-CM | POA: Diagnosis not present

## 2022-08-14 DIAGNOSIS — J3081 Allergic rhinitis due to animal (cat) (dog) hair and dander: Secondary | ICD-10-CM | POA: Diagnosis not present

## 2022-08-26 DIAGNOSIS — J3081 Allergic rhinitis due to animal (cat) (dog) hair and dander: Secondary | ICD-10-CM | POA: Diagnosis not present

## 2022-08-26 DIAGNOSIS — J3089 Other allergic rhinitis: Secondary | ICD-10-CM | POA: Diagnosis not present

## 2022-08-26 DIAGNOSIS — J301 Allergic rhinitis due to pollen: Secondary | ICD-10-CM | POA: Diagnosis not present

## 2022-09-11 DIAGNOSIS — J4540 Moderate persistent asthma, uncomplicated: Secondary | ICD-10-CM | POA: Diagnosis not present

## 2022-09-11 DIAGNOSIS — J301 Allergic rhinitis due to pollen: Secondary | ICD-10-CM | POA: Diagnosis not present

## 2023-02-05 DIAGNOSIS — Z79899 Other long term (current) drug therapy: Secondary | ICD-10-CM | POA: Diagnosis not present

## 2023-02-05 DIAGNOSIS — Z125 Encounter for screening for malignant neoplasm of prostate: Secondary | ICD-10-CM | POA: Diagnosis not present

## 2023-02-05 DIAGNOSIS — E785 Hyperlipidemia, unspecified: Secondary | ICD-10-CM | POA: Diagnosis not present

## 2023-02-05 DIAGNOSIS — R7301 Impaired fasting glucose: Secondary | ICD-10-CM | POA: Diagnosis not present

## 2023-02-05 DIAGNOSIS — Z0000 Encounter for general adult medical examination without abnormal findings: Secondary | ICD-10-CM | POA: Diagnosis not present

## 2023-02-05 DIAGNOSIS — M109 Gout, unspecified: Secondary | ICD-10-CM | POA: Diagnosis not present

## 2023-02-10 DIAGNOSIS — J343 Hypertrophy of nasal turbinates: Secondary | ICD-10-CM | POA: Diagnosis not present

## 2023-02-10 DIAGNOSIS — J0101 Acute recurrent maxillary sinusitis: Secondary | ICD-10-CM | POA: Diagnosis not present

## 2023-02-10 DIAGNOSIS — H903 Sensorineural hearing loss, bilateral: Secondary | ICD-10-CM | POA: Diagnosis not present

## 2023-03-11 DIAGNOSIS — H903 Sensorineural hearing loss, bilateral: Secondary | ICD-10-CM | POA: Diagnosis not present

## 2023-04-01 DIAGNOSIS — J343 Hypertrophy of nasal turbinates: Secondary | ICD-10-CM | POA: Diagnosis not present

## 2023-04-01 DIAGNOSIS — J310 Chronic rhinitis: Secondary | ICD-10-CM | POA: Diagnosis not present

## 2023-07-01 DIAGNOSIS — J4540 Moderate persistent asthma, uncomplicated: Secondary | ICD-10-CM | POA: Diagnosis not present

## 2023-07-01 DIAGNOSIS — J3089 Other allergic rhinitis: Secondary | ICD-10-CM | POA: Diagnosis not present

## 2023-07-01 DIAGNOSIS — J301 Allergic rhinitis due to pollen: Secondary | ICD-10-CM | POA: Diagnosis not present

## 2023-07-01 DIAGNOSIS — R052 Subacute cough: Secondary | ICD-10-CM | POA: Diagnosis not present

## 2023-08-18 DIAGNOSIS — R252 Cramp and spasm: Secondary | ICD-10-CM | POA: Diagnosis not present

## 2023-08-18 DIAGNOSIS — R059 Cough, unspecified: Secondary | ICD-10-CM | POA: Diagnosis not present

## 2023-08-18 DIAGNOSIS — G4700 Insomnia, unspecified: Secondary | ICD-10-CM | POA: Diagnosis not present

## 2023-08-18 DIAGNOSIS — J45901 Unspecified asthma with (acute) exacerbation: Secondary | ICD-10-CM | POA: Diagnosis not present

## 2023-09-15 ENCOUNTER — Other Ambulatory Visit: Payer: Self-pay | Admitting: Allergy and Immunology

## 2023-09-15 ENCOUNTER — Ambulatory Visit
Admission: RE | Admit: 2023-09-15 | Discharge: 2023-09-15 | Disposition: A | Discharge status: Home / Self Care | Payer: BLUE CROSS/BLUE SHIELD | Source: Ambulatory Visit | Attending: Allergy and Immunology | Admitting: Allergy and Immunology

## 2023-09-15 DIAGNOSIS — J301 Allergic rhinitis due to pollen: Secondary | ICD-10-CM | POA: Diagnosis not present

## 2023-09-15 DIAGNOSIS — J4540 Moderate persistent asthma, uncomplicated: Secondary | ICD-10-CM | POA: Diagnosis not present

## 2023-09-15 DIAGNOSIS — R052 Subacute cough: Secondary | ICD-10-CM

## 2023-09-15 DIAGNOSIS — J3089 Other allergic rhinitis: Secondary | ICD-10-CM | POA: Diagnosis not present

## 2023-09-24 ENCOUNTER — Ambulatory Visit (INDEPENDENT_AMBULATORY_CARE_PROVIDER_SITE_OTHER): Payer: Self-pay | Admitting: Audiology

## 2023-09-24 DIAGNOSIS — H903 Sensorineural hearing loss, bilateral: Secondary | ICD-10-CM

## 2023-09-24 NOTE — Progress Notes (Signed)
Patient was seen today for a hearing aid check. Otoscopy revealed clear canals bilaterally. He reported the hearing aids were slowly working out of his ears throughout the day. Changed him to medium, open domes with retention lines. 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 Reconnected his hearing aids to his Phone Clip. He was happy with the changes today. Instructed him to call if any concerns arise.  Conley Rolls Dhwani Venkatesh, AUD, CCC-A 09/24/23

## 2023-10-09 ENCOUNTER — Ambulatory Visit (INDEPENDENT_AMBULATORY_CARE_PROVIDER_SITE_OTHER): Payer: BLUE CROSS/BLUE SHIELD

## 2023-10-09 ENCOUNTER — Encounter (INDEPENDENT_AMBULATORY_CARE_PROVIDER_SITE_OTHER): Payer: Self-pay

## 2023-10-09 VITALS — Ht 70.0 in | Wt 240.0 lb

## 2023-10-09 DIAGNOSIS — J324 Chronic pansinusitis: Secondary | ICD-10-CM

## 2023-10-09 DIAGNOSIS — R0982 Postnasal drip: Secondary | ICD-10-CM | POA: Diagnosis not present

## 2023-10-12 DIAGNOSIS — J324 Chronic pansinusitis: Secondary | ICD-10-CM | POA: Insufficient documentation

## 2023-10-12 DIAGNOSIS — R0982 Postnasal drip: Secondary | ICD-10-CM | POA: Insufficient documentation

## 2023-10-12 NOTE — Progress Notes (Signed)
Patient ID: Chris Mosley, male   DOB: November 23, 1961, 62 y.o.   MRN: [id number]  Follow-up: Chronic nasal congestion  HPI: The patient is a 63 year old male who returns today for his follow-up evaluation.  The patient has a history of chronic nasal obstruction. 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  He underwent septoplasty and turbinate reduction surgery in August 2023.  At his last visit 6 months ago, his septum and turbinates were well-healed.  The patient returns today reporting no significant difficulty with his nasal breathing.  However, he complains of increasing sinus congestion, facial pressure, and chronic postnasal drainage.  He was treated with prednisone, with temporary improvement in his symptoms.  Currently he denies any fever or visual change.  Exam: General: Communicates without difficulty, well nourished, no acute distress. Head: Normocephalic, no evidence injury, no tenderness, facial buttresses intact without stepoff. Face/sinus: No tenderness to palpation and percussion. Facial movement is normal and symmetric. Eyes: PERRL, EOMI. No scleral icterus, conjunctivae clear. Neuro: CN II exam reveals vision grossly intact.  No nystagmus at any point of gaze. Ears: Auricles well formed without lesions.  Ear canals are intact without mass or lesion.  No erythema or edema is appreciated.  The TMs are intact without fluid. Nose: External evaluation reveals normal support and skin without lesions.  Dorsum is intact.  Anterior rhinoscopy reveals congested mucosa over anterior aspect of inferior turbinates and intact septum.  No purulence noted. Oral:  Oral cavity and oropharynx are intact, symmetric, without erythema or edema.  Mucosa is moist without lesions. Neck: Full range of motion without pain.  There is no significant lymphadenopathy.  No masses palpable.  Thyroid bed within normal limits to palpation.  Parotid glands and submandibular glands  equal bilaterally without mass.  Trachea is midline. Neuro:  CN 2-12 grossly intact.    Assessment: 1.  Chronic rhinitis with mild nasal mucosal congestion. 2.  His septum and turbinates are well-healed. 3.  Chronic postnasal drainage. 4.  Chronic facial pressure and pain, possible chronic sinusitis.  Plan: 1.  The physical exam findings are reviewed with the patient. 2.  Continue Atrovent nasal spray to treat his postnasal drainage. 3.  Sinus CT scan to evaluate for chronic sinusitis. 4.  The patient will return for reevaluation after the CT scan.

## 2023-11-03 ENCOUNTER — Ambulatory Visit (HOSPITAL_COMMUNITY)
Admission: RE | Admit: 2023-11-03 | Discharge: 2023-11-03 | Disposition: A | Discharge status: Home / Self Care | Payer: BLUE CROSS/BLUE SHIELD | Source: Ambulatory Visit | Attending: Otolaryngology | Admitting: Otolaryngology

## 2023-11-03 DIAGNOSIS — J324 Chronic pansinusitis: Secondary | ICD-10-CM | POA: Insufficient documentation

## 2023-11-03 DIAGNOSIS — J3489 Other specified disorders of nose and nasal sinuses: Secondary | ICD-10-CM | POA: Diagnosis not present

## 2023-11-05 ENCOUNTER — Telehealth (INDEPENDENT_AMBULATORY_CARE_PROVIDER_SITE_OTHER): Payer: Self-pay | Admitting: Otolaryngology

## 2023-11-05 NOTE — Telephone Encounter (Signed)
Spoke with patient, confirmed location and time

## 2023-11-06 ENCOUNTER — Encounter (INDEPENDENT_AMBULATORY_CARE_PROVIDER_SITE_OTHER): Payer: Self-pay

## 2023-11-06 ENCOUNTER — Ambulatory Visit (INDEPENDENT_AMBULATORY_CARE_PROVIDER_SITE_OTHER): Payer: BLUE CROSS/BLUE SHIELD | Admitting: Otolaryngology

## 2023-11-06 VITALS — Ht 70.0 in | Wt 240.0 lb

## 2023-11-06 DIAGNOSIS — R0982 Postnasal drip: Secondary | ICD-10-CM | POA: Diagnosis not present

## 2023-11-06 DIAGNOSIS — R0981 Nasal congestion: Secondary | ICD-10-CM | POA: Diagnosis not present

## 2023-11-06 DIAGNOSIS — J310 Chronic rhinitis: Secondary | ICD-10-CM

## 2023-11-08 DIAGNOSIS — J310 Chronic rhinitis: Secondary | ICD-10-CM | POA: Insufficient documentation

## 2023-11-08 NOTE — Progress Notes (Signed)
Patient ID: Chris Mosley, male   DOB: 1961/07/10, 62 y.o.   MRN: [id number]  Follow-up: Chronic nasal congestion, chronic postnasal drainage  HPI: The patient is a 62 year old male who returns today for his follow-up evaluation.  The patient has a history of chronic nasal obstruction.  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  He underwent septoplasty and turbinate reduction surgery in August 2023.  Over the past year, the patient has noted possible recurrent sinusitis.  He was treated with multiple antibiotics.  He recently underwent a sinus CT scan.  No significant sinus infection was noted.  His septum and turbinates were well-healed.  The patient returns today complaining of intermittent postnasal drainage.  He denies any facial pain, fever, or visual change.  Exam: General: Communicates without difficulty, well nourished, no acute distress. Head: Normocephalic, no evidence injury, no tenderness, facial buttresses intact without stepoff. Face/sinus: No tenderness to palpation and percussion. Facial movement is normal and symmetric. Eyes: PERRL, EOMI. No scleral icterus, conjunctivae clear. Neuro: CN II exam reveals vision grossly intact.  No nystagmus at any point of gaze. Ears: Auricles well formed without lesions.  Ear canals are intact without mass or lesion.  No erythema or edema is appreciated.  The TMs are intact without fluid. Nose: External evaluation reveals normal support and skin without lesions.  Dorsum is intact.  Anterior rhinoscopy reveals congested mucosa over anterior aspect of inferior turbinates and intact septum.  No purulence noted. Oral:  Oral cavity and oropharynx are intact, symmetric, without erythema or edema.  Mucosa is moist without lesions. Neck: Full range of motion without pain.  There is no significant lymphadenopathy.  No masses palpable.  Thyroid bed within normal limits to palpation.  Parotid glands and submandibular  glands equal bilaterally without mass.  Trachea is midline. Neuro:  CN 2-12 grossly intact.   Assessment: 1.  Chronic rhinitis with nasal mucosal congestion. 2.  Chronic postnasal drainage 3.  His septum and turbinates are well-healed. 4.  No significant sinusitis was noted on his recent CT scan.  Plan: 1.  The physical exam findings and the CT images are reviewed with the patient. 2.  The patient is reassured that no significant infection is noted. 3.  Continue with nasal saline irrigation. 4.  Atrovent as needed to treat the postnasal drainage. 5.  The patient is encouraged to call with any questions or concerns.

## 2024-01-05 DIAGNOSIS — I10 Essential (primary) hypertension: Secondary | ICD-10-CM | POA: Diagnosis not present

## 2024-01-05 DIAGNOSIS — L2989 Other pruritus: Secondary | ICD-10-CM | POA: Diagnosis not present

## 2024-02-16 DIAGNOSIS — Z125 Encounter for screening for malignant neoplasm of prostate: Secondary | ICD-10-CM | POA: Diagnosis not present

## 2024-02-16 DIAGNOSIS — M109 Gout, unspecified: Secondary | ICD-10-CM | POA: Diagnosis not present

## 2024-02-16 DIAGNOSIS — E669 Obesity, unspecified: Secondary | ICD-10-CM | POA: Diagnosis not present

## 2024-02-16 DIAGNOSIS — I10 Essential (primary) hypertension: Secondary | ICD-10-CM | POA: Diagnosis not present

## 2024-02-16 DIAGNOSIS — Z0000 Encounter for general adult medical examination without abnormal findings: Secondary | ICD-10-CM | POA: Diagnosis not present

## 2024-02-16 DIAGNOSIS — E785 Hyperlipidemia, unspecified: Secondary | ICD-10-CM | POA: Diagnosis not present

## 2024-03-04 DIAGNOSIS — I10 Essential (primary) hypertension: Secondary | ICD-10-CM | POA: Diagnosis not present

## 2024-04-20 ENCOUNTER — Telehealth (INDEPENDENT_AMBULATORY_CARE_PROVIDER_SITE_OTHER): Payer: Self-pay | Admitting: Otolaryngology

## 2024-04-20 NOTE — Telephone Encounter (Signed)
 Left voicemail and/or MyChart message with time and location-[address] [place], [place] [address]

## 2024-04-21 ENCOUNTER — Ambulatory Visit (INDEPENDENT_AMBULATORY_CARE_PROVIDER_SITE_OTHER): Payer: BC Managed Care – PPO

## 2024-06-15 ENCOUNTER — Encounter (INDEPENDENT_AMBULATORY_CARE_PROVIDER_SITE_OTHER): Payer: Self-pay | Admitting: Otolaryngology

## 2024-06-15 ENCOUNTER — Ambulatory Visit (INDEPENDENT_AMBULATORY_CARE_PROVIDER_SITE_OTHER): Admitting: Otolaryngology

## 2024-06-15 VITALS — BP 147/79 | HR 66

## 2024-06-15 DIAGNOSIS — R0981 Nasal congestion: Secondary | ICD-10-CM

## 2024-06-15 DIAGNOSIS — J310 Chronic rhinitis: Secondary | ICD-10-CM

## 2024-06-15 DIAGNOSIS — R0982 Postnasal drip: Secondary | ICD-10-CM | POA: Diagnosis not present

## 2024-06-15 MED ORDER — IPRATROPIUM BROMIDE 0.06 % NA SOLN: 2.0000 | Freq: Two times a day (BID) | NASAL | 12 refills | Status: AC | PRN

## 2024-06-15 NOTE — Progress Notes (Unsigned)
 Patient ID: Chris Mosley, male   DOB: 23-Aug-1961, 63 y.o.   MRN: [id number]  Follow-up: Chronic nasal congestion, chronic postnasal drainage   HPI: The patient is a 63 year old male who returns today for his follow-up evaluation.  The patient was previously seen for his chronic nasal obstruction and chronic postnasal drainage.  He underwent septoplasty and turbinate reduction surgery in 2023.  His nasal drainage was treated with nasal saline irrigation and Atrovent.  The patient returns today complaining of intermittent drainage and nasal congestion.  He is currently on Singulair and Xyzal.  He denies any facial pain, fever, or visual change.  Exam: General: Communicates without difficulty, well nourished, no acute distress. Head: Normocephalic, no evidence injury, no tenderness, facial buttresses intact without stepoff. Face/sinus: No tenderness to palpation and percussion. Facial movement is normal and symmetric. Eyes: PERRL, EOMI. No scleral icterus, conjunctivae clear. Neuro: CN II exam reveals vision grossly intact.  No nystagmus at any point of gaze. Ears: Auricles well formed without lesions.  Ear canals are intact without mass or lesion.  No erythema or edema is appreciated.  The TMs are intact without fluid. Nose: External evaluation reveals normal support and skin without lesions.  Dorsum is intact.  Anterior rhinoscopy reveals congested mucosa over anterior aspect of inferior turbinates and intact septum.  No purulence noted. Oral:  Oral cavity and oropharynx are intact, symmetric, without erythema or edema.  Mucosa is moist without lesions. Neck: Full range of motion without pain.  There is no significant lymphadenopathy.  No masses palpable.  Thyroid bed within normal limits to palpation.  Parotid glands and submandibular glands equal bilaterally without mass.  Trachea is midline. Neuro:  CN 2-12 grossly intact.   Assessment: 1.  Chronic rhinitis with nasal mucosal congestion and chronic  postnasal drainage. 2.  His septum and turbinates are well-healed. 3.  No acute infection is noted today.  Plan: 1.  The physical exam findings are reviewed with the patient. 2.  Continue with Singulair and Xyzal daily. 3.  Restart Atrovent nasal spray and nasal saline irrigation. 4.  The patient is encouraged to call with any questions or concerns.

## 2024-07-04 ENCOUNTER — Ambulatory Visit (INDEPENDENT_AMBULATORY_CARE_PROVIDER_SITE_OTHER): Payer: Self-pay | Admitting: Audiology

## 2024-07-04 DIAGNOSIS — H903 Sensorineural hearing loss, bilateral: Secondary | ICD-10-CM

## 2024-07-04 NOTE — Progress Notes (Unsigned)
  [address] [place], [place] [address] [phone]  Hearing Aid Check     Chris Mosley comes for a scheduled appointment for a hearing aid check.  Time in:*** Time out:*** Accompanied by:***   Right Left  Hearing aid manufacturer    Hearing aid style    Hearing aid battery    Receiver    Dome/ custom earpiece    Retention wire    Warranty expiration date    Loss and Damage    Additional accessories Expiration date    Initial fitting date    Device was fit at:    Hearing aid services Bundled until:  Bundled until:     Chief complaint: Patient reports ***.  Actions taken: Inspection of the device and listening check showed that ***.  Services fee: $*** was paid at checkout.     Recommend: Return for a hearing aid check , as needed. Return for a hearing evaluation and to see an ENT, if concerns with hearing changes arise. ***  Neyda Durango MARIE LEROUX-MARTINEZ, AUD

## 2024-08-26 DIAGNOSIS — I10 Essential (primary) hypertension: Secondary | ICD-10-CM | POA: Diagnosis not present

## 2024-08-26 DIAGNOSIS — E785 Hyperlipidemia, unspecified: Secondary | ICD-10-CM | POA: Diagnosis not present

## 2024-08-26 DIAGNOSIS — M109 Gout, unspecified: Secondary | ICD-10-CM | POA: Diagnosis not present

## 2024-10-10 DIAGNOSIS — H35413 Lattice degeneration of retina, bilateral: Secondary | ICD-10-CM | POA: Diagnosis not present
